# Patient Record
Sex: Male | Born: 1960
Health system: Southern US, Community
[De-identification: ages and names within clinical notes are randomized; demographics above are authoritative.]

## PROBLEM LIST (undated history)

## (undated) DIAGNOSIS — T7840XA Allergy, unspecified, initial encounter: Secondary | ICD-10-CM

## (undated) DIAGNOSIS — J45909 Unspecified asthma, uncomplicated: Secondary | ICD-10-CM

## (undated) DIAGNOSIS — M1990 Unspecified osteoarthritis, unspecified site: Secondary | ICD-10-CM

## (undated) HISTORY — DX: Unspecified osteoarthritis, unspecified site: M19.90

## (undated) HISTORY — DX: Unspecified asthma, uncomplicated: J45.909

## (undated) HISTORY — PX: WISDOM TOOTH EXTRACTION: SHX21

## (undated) HISTORY — DX: Allergy, unspecified, initial encounter: T78.40XA

---

## 1997-07-24 ENCOUNTER — Emergency Department (HOSPITAL_COMMUNITY): Admission: EM | Admit: 1997-07-24 | Discharge: 1997-07-24 | Payer: Self-pay | Admitting: Emergency Medicine

## 2002-01-14 ENCOUNTER — Emergency Department (HOSPITAL_COMMUNITY): Admission: EM | Admit: 2002-01-14 | Discharge: 2002-01-14 | Payer: Self-pay | Admitting: Emergency Medicine

## 2013-10-19 HISTORY — PX: COLONOSCOPY: SHX174

## 2017-05-02 DIAGNOSIS — J209 Acute bronchitis, unspecified: Secondary | ICD-10-CM | POA: Diagnosis not present

## 2017-07-18 ENCOUNTER — Encounter: Payer: Self-pay | Admitting: Podiatry

## 2017-07-18 ENCOUNTER — Ambulatory Visit (INDEPENDENT_AMBULATORY_CARE_PROVIDER_SITE_OTHER): Payer: Commercial Managed Care - PPO | Admitting: Podiatry

## 2017-07-18 DIAGNOSIS — L03031 Cellulitis of right toe: Secondary | ICD-10-CM | POA: Diagnosis not present

## 2017-07-18 DIAGNOSIS — L600 Ingrowing nail: Secondary | ICD-10-CM

## 2017-07-18 NOTE — Progress Notes (Signed)
This patient  Presents to the office with chief complaint of a painful right big toenail, right foot .  He says that years ago he had an injury to his toe, which has led to his nail to grow and be thick disfigured and discolored.  He says he had nail surgery on the inside of this big toe nail years ago and now he is experiences pain on the outside of the big toenail, right foot. He says that is very tender to the touch and it is extremely painful at night after work.  He has attempted to treated with Epson salt soaks, but the problem persists.  He presents the office today for an evaluation and treatment of this painful ingrowing toenail.  General Appearance  Alert, conversant and in no acute stress.  Vascular  Dorsalis pedis and posterior tibial  pulses are palpable  bilaterally.  Capillary return is within normal limits  bilaterally. Temperature is within normal limits  bilaterally.  Neurologic  Senn-Weinstein monofilament wire test within normal limits  bilaterally. Muscle power within normal limits bilaterally.  Nails Thick disfigured discolored nails with subungual debris  Hallux right foot.  Patient has redness, swelling and pain noted at the distal lateral aspect of the right hallux.    Orthopedic  No limitations of motion of motion feet .  No crepitus or effusions noted.  No bony pathology or digital deformities noted.  Skin  normotropic skin with no porokeratosis noted bilaterally.  No signs of infections or ulcers noted.      Paronychia lateral border right great toenail.  Ingrown nail secondary to fungus right hallux toenail  IE  Nail surgery.  Treatment options and alternatives discussed.  Recommended permanent phenol matrixectomy and patient agreed.  Right hallux  was prepped with alcohol and a toe block of 3cc of 2% lidocaine plain was administered in a digital toe block. .  The toe was then prepped with betadine solution .  The offending nail border was then excised and matrix tissue  exposed.  Phenol was then applied to the matrix tissue followed by an alcohol wash.  Antibiotic ointment and a dry sterile dressing was applied.  The patient was dispensed instructions for aftercare.  RTC prn.     Gardiner Barefoot DPM

## 2017-08-04 ENCOUNTER — Encounter: Payer: Self-pay | Admitting: Podiatry

## 2017-08-04 ENCOUNTER — Ambulatory Visit (INDEPENDENT_AMBULATORY_CARE_PROVIDER_SITE_OTHER): Payer: Commercial Managed Care - PPO | Admitting: Podiatry

## 2017-08-04 DIAGNOSIS — L03031 Cellulitis of right toe: Secondary | ICD-10-CM | POA: Diagnosis not present

## 2017-08-04 MED ORDER — DOXYCYCLINE HYCLATE 100 MG PO TABS: 100.0000 mg | ORAL_TABLET | Freq: Two times a day (BID) | ORAL | 0 refills | Status: DC

## 2017-08-04 NOTE — Progress Notes (Signed)
This patient presents to the office with continued pain noted in his right great toe.  Patient had nail surgery performed on the right great toe on 07/18/2017.  He says the redness and swelling has never cleared p at the base of the nail surgery.  He says he has been soaking and bandaging his toe as directed.  He says it stlil remains sore a at the base of the surgical site.  He presents the office today for continued evaluation and treatment of this painful right toe.  General Appearance  Alert, conversant and in no acute stress.  Vascular  Dorsalis pedis and posterior tibial  pulses are palpable  bilaterally.  Capillary return is within normal limits  bilaterally. Temperature is within normal limits  bilaterally.  Neurologic  Senn-Weinstein monofilament wire test within normal limits  bilaterally. Muscle power within normal limits bilaterally.  Nails redness and swelling and pain noted at the proximal nail fold right hallux.  No drainage noted.  Orthopedic  No limitations of motion of motion feet .  No crepitus or effusions noted.  No bony pathology or digital deformities noted.  Skin  normotropic skin with no porokeratosis noted bilaterally.  No signs of infections or ulcers noted.     Paronychia right hallux.  ROV.  After examination of the surgical site. I decided to prescribe doxycycline to be sent into the [facility] in [place].  Continue soaks at home.  Return to the clinic when necessary   Gardiner Barefoot DPM

## 2017-09-15 DIAGNOSIS — Z1211 Encounter for screening for malignant neoplasm of colon: Secondary | ICD-10-CM | POA: Diagnosis not present

## 2017-09-15 DIAGNOSIS — Z0000 Encounter for general adult medical examination without abnormal findings: Secondary | ICD-10-CM | POA: Diagnosis not present

## 2017-09-15 DIAGNOSIS — Z125 Encounter for screening for malignant neoplasm of prostate: Secondary | ICD-10-CM | POA: Diagnosis not present

## 2018-02-13 DIAGNOSIS — R062 Wheezing: Secondary | ICD-10-CM | POA: Diagnosis not present

## 2018-02-13 DIAGNOSIS — J101 Influenza due to other identified influenza virus with other respiratory manifestations: Secondary | ICD-10-CM | POA: Diagnosis not present

## 2018-02-27 ENCOUNTER — Encounter: Payer: Self-pay | Admitting: Family Medicine

## 2018-02-27 ENCOUNTER — Ambulatory Visit (INDEPENDENT_AMBULATORY_CARE_PROVIDER_SITE_OTHER): Payer: Commercial Managed Care - PPO | Admitting: Family Medicine

## 2018-02-27 DIAGNOSIS — J324 Chronic pansinusitis: Secondary | ICD-10-CM

## 2018-02-27 DIAGNOSIS — R519 Headache, unspecified: Secondary | ICD-10-CM

## 2018-02-27 DIAGNOSIS — R51 Headache: Secondary | ICD-10-CM

## 2018-02-27 DIAGNOSIS — R030 Elevated blood-pressure reading, without diagnosis of hypertension: Secondary | ICD-10-CM

## 2018-02-27 DIAGNOSIS — Z9189 Other specified personal risk factors, not elsewhere classified: Secondary | ICD-10-CM

## 2018-02-27 DIAGNOSIS — J3089 Other allergic rhinitis: Secondary | ICD-10-CM

## 2018-02-27 DIAGNOSIS — F1510 Other stimulant abuse, uncomplicated: Secondary | ICD-10-CM | POA: Diagnosis not present

## 2018-02-27 MED ORDER — FLUTICASONE PROPIONATE 50 MCG/ACT NA SUSP: 1.0000 | Freq: Two times a day (BID) | NASAL | 2 refills | Status: DC

## 2018-02-27 MED ORDER — FEXOFENADINE HCL 180 MG PO TABS: 180.0000 mg | ORAL_TABLET | Freq: Every day | ORAL | 1 refills | Status: DC

## 2018-02-27 NOTE — Progress Notes (Signed)
New patient office visit note:  Impression and Recommendations:    1. Nonintractable episodic headache, unspecified headache type   2. Caffeine abuse, continuous ([place])   3. At high risk for dehydration   4. Chronic pansinusitis   5. Environmental and seasonal allergies   6. Elevated blood pressure reading     Nonintractable episodic headache, unspecified headache type - Plan: fluticasone (FLONASE) 50 MCG/ACT nasal spray  Caffeine abuse, continuous ([facility])  At high risk for dehydration  Chronic pansinusitis - Plan: fluticasone (FLONASE) 50 MCG/ACT nasal spray, fexofenadine (ALLEGRA) 180 MG tablet  Environmental and seasonal allergies - Plan: fluticasone (FLONASE) 50 MCG/ACT nasal spray, fexofenadine (ALLEGRA) 180 MG tablet  Elevated blood pressure reading   1. Chronic HA -Patient consumes 10 cups of coffee daily or more and intermittently takes tylenol and excedrin PRN- which has been daily for a month now.  Discussed that the patient has to slowly wean down his caffeine intake, and I discussed rebound HA med use - additionally, tight upper traps and cervical muscles are likely playing role in his HA's. -Advised the patient to consume half of their body weight in water and to increase their water intake by one 16.9 ounce bottle with every 30 minutes of exercise. Also discussed that the patient could use seltzer water to replace his water intake.    2. Sinus Problems -Recommended that the patient wears a N95 mask daily while working.  -Advised the patient to began using AYR or Neilmed sinus rinses BID followed by flonase BID (one spray to each nostril). Advised that the patient may also incorporate allegra or claritin daily.  -Advised that the patient can use a humidifier to aid with sinus issues and dry air.  -Will refer the patient to an ENT specialist to further evaluate sinuses in future if he can't better control sx with conservative measures.   3. Elevated blood  pressure -Blood pressure at 145/95 today. Recheck at 138/84 - d/c pt lifestyle changes to help control Bp -Goal blood pressure at or below 130/80.  -Rec to check Home BP's, then we will address at next OV if we find consistent elevated blood pressure's at home  Advised the patient to bring in the labs from his job and if they are more than 6 months old, then we will repeat them in the office.     Education and routine counseling performed. Handouts provided.   Meds ordered this encounter  Medications  . fluticasone (FLONASE) 50 MCG/ACT nasal spray    Sig: Place 1 spray into both nostrils 2 (two) times daily. After sinus rinses!    Dispense:  1 g    Refill:  2  . fexofenadine (ALLEGRA) 180 MG tablet    Sig: Take 1 tablet (180 mg total) by mouth daily.    Dispense:  90 tablet    Refill:  1    Medications Discontinued During This Encounter  Medication Reason  . albuterol (PROVENTIL HFA;VENTOLIN HFA) 108 (90 Base) MCG/ACT inhaler   . doxycycline (VIBRA-TABS) 100 MG tablet Completed Course     Gross side effects, risk and benefits, and alternatives of medications discussed with patient.  Patient is aware that all medications have potential side effects and we are unable to predict every side effect or drug-drug interaction that may occur.  Expresses verbal understanding and consents to current therapy plan and treatment regimen.  Return for f/up in near future for CPE + FBW;   2) also need f/up for  BP, sinus, HA's etc.  Please see AVS handed out to patient at the end of our visit for further patient instructions/ counseling done pertaining to today's office visit.    Note:  This document was prepared using Dragon voice recognition software and may include unintentional dictation errors.  This document serves as a record of services personally performed by Mellody Dance, DO. It was created on her behalf by Steva Colder, a trained medical scribe. The creation of this record is based  on the scribe's personal observations and the provider's statements to them.   I have reviewed the above medical documentation for accuracy and completeness and I concur.  Mellody Dance, DO 02/28/2018 7:20 AM      ---------------------------------------------------------------------------------------------------------------------------------------------------------------------------------------------    Subjective:    Chief complaint:   Chief Complaint  Patient presents with  . Establish Care     HPI: Zachary Higgins is a pleasant 58 y.o. male who presents to [place] [facility] today to review their medical history with me and establish care.   I asked the patient to review their chronic problem list with me to ensure everything was updated and accurate.    All recent office visits with other providers, any medical records that patient brought in etc  - I reviewed today.     We asked pt to get [place] their medical records from [facility] providers/ specialists that they had seen within the past 3-5 years- if they are in private practice and/or do not work for [employer], [facility], [place], [place] or [employer] owned practice.  Told them to call their specialists to clarify this if they are not sure.    PMHx:  He has a hx of recurrent undiagnosed headaches that are chronic. He has had a migraine headache for 1 month with blood noted when he blows his nose. He was recently diagnosed with flu and took prednisone for it. His fever has subsided following his recent diagnosis for the flu. He hasn't been evaluated by an ENT specialist. He has biometric screening completed through his job. His prior PCP was Dr. Cecille Amsterdam at [facility] in [place]. He has a throbbing, intermittent, 5/10, HA at this time. He treats his HA with tylenol and excedrin PRN. He doesn't consume water as he should. He has been given injections for his tendonitis by his PCP and he doesn't have an  orthopedist.   SHx:  Patient used to smoke 1 PPD for about 40 years, however, he quit smoking cigarettes 5 years ago. He quit smoking because he got married and his wife said that she didn't want him to smoke. He used to used chewing tobacco, however, he has quit that. He doesn't consume ETOH. He does consume approximately 10 cups of coffee per day and he consumes 0.5 pot before work. 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 He is married to his wife Horris Latino and has two adult children ages 31 and 51. He has 3 grandchildren. He is also a taxidermist.   PSHx:  He hasn't had any surgeries.   FMHx He has a family hx of DM, heart disease, MI, CA (maternal uncles with metastases), alcoholism, HTN, elevated cholesterol.     Wt Readings from Last 3 Encounters:  02/27/18 139 lb (63 kg)   BP Readings from Last 3 Encounters:  02/27/18 138/84   Pulse Readings from Last 3 Encounters:  02/27/18 68  BMI Readings from Last 3 Encounters:  02/27/18 19.94 kg/m    Patient Care Team    Relationship Specialty Notifications Start End  Mellody Dance, DO PCP - General Family Medicine  02/27/18   Jackquline Denmark, MD Consulting Physician Gastroenterology  02/27/18     Patient Active Problem List   Diagnosis Date Noted  . Nonintractable episodic headache 02/27/2018  . Caffeine abuse, continuous ([place]) 02/27/2018  . Chronic pansinusitis 02/27/2018  . Environmental and seasonal allergies 02/27/2018  . At high risk for dehydration 02/27/2018  . Elevated blood pressure reading 02/27/2018       As reported by pt:  History reviewed. No pertinent past medical history.   History reviewed. No pertinent surgical history.   Family History  Problem Relation Age of Onset  . Diabetes Maternal Uncle   . Diabetes Paternal Uncle      Social History   Substance and Sexual Activity  Drug Use Never     Social History   Substance and Sexual  Activity  Alcohol Use Not Currently     Social History   Tobacco Use  Smoking Status Former Smoker  . Packs/day: 1.00  . Types: Cigarettes  . Last attempt to quit: 2015  . Years since quitting: 5.1  Smokeless Tobacco Former User     No outpatient medications have been marked as taking for the 02/27/18 encounter (Office Visit) with Mellody Dance, DO.    Allergies: Penicillins   Review of Systems  Constitutional: Negative for chills, diaphoresis, fever, malaise/fatigue and weight loss.  HENT: Negative for congestion, sore throat and tinnitus.   Eyes: Negative for blurred vision, double vision and photophobia.  Respiratory: Negative for cough and wheezing.   Cardiovascular: Negative for chest pain and palpitations.  Gastrointestinal: Negative for blood in stool, diarrhea, nausea and vomiting.  Genitourinary: Negative for dysuria, frequency and urgency.  Musculoskeletal: Negative for joint pain and myalgias.  Skin: Negative for itching and rash.  Neurological: Negative for dizziness, focal weakness, weakness and headaches.  Endo/Heme/Allergies: Negative for environmental allergies and polydipsia. Does not bruise/bleed easily.  Psychiatric/Behavioral: Negative for depression and memory loss. The patient is not nervous/anxious and does not have insomnia.         Objective:   Blood pressure 138/84, pulse 68, temperature 97.9 F (36.6 C), height 5\' 10"  (1.778 m), weight 139 lb (63 kg), SpO2 100 %. Body mass index is 19.94 kg/m. General: Well Developed, well nourished, and in no acute distress.  Neuro: Alert and oriented x3, extra-ocular muscles intact, sensation grossly intact.  HEENT:[place]/AT, PERRLA, neck supple, No carotid bruits Skin: no gross rashes  Cardiac: Regular rate and rhythm Respiratory: Essentially clear to auscultation bilaterally. Not using accessory muscles, speaking in full sentences.  Abdominal: not grossly distended Musculoskeletal: Ambulates w/o  diff, FROM * 4 ext.  Vasc: less 2 sec cap RF, warm and pink  Psych:  No HI/SI, judgement and insight good, Euthymic mood. Full Affect.    No results found for this or any previous visit (from the past 2160 hour(s)).

## 2018-02-27 NOTE — Patient Instructions (Addendum)
Needs photo!   Began using AYR or Neilmed sinus rinses two times a day followed by flonase two times a day (one spray to each nostril).  Make sure to use a N95 mask during the day while working.  You can also use allegra daily  Also using a humidifier can help with dry air.  -Also drinking one half your weight in ounces of water per day is very important to prevent headaches.  - Caffeine Use Disorder  Having caffeine use disorder means that a person cannot control how much caffeine he or she consumes. Caffeine is a drug that is found in many foods and beverages, such as coffee, tea, chocolate, and energy drinks. Caffeine is also found in some medicines and over-the-counter products like alertness tablets or weight loss pills. Caffeine may be bad for your health when you have too much. People with caffeine use disorder continue to consume caffeine even though they know it may be causing problems with their physical or mental health. What are the causes? This condition is caused by consuming too much caffeine over time. What increases the risk? You are more likely to develop this condition if:  You have more than 400 mg of caffeine--or 4 cups of coffee--each day.  You have a history or family history of a substance use disorder or alcohol abuse.  You have a psychiatric or mental health disorder, or you have been treated for one. What are the signs or symptoms? Symptoms of this condition include:  Finding it hard to get through the day without caffeine. Caffeine is hurting your ability to function.  Wanting to cut down on (limit) your caffeine intake but finding it hard to do so.  Having withdrawal symptoms within 24 hours of not having caffeine. Withdrawal symptoms may include: ? Headaches. ? Feeling tired (fatigued) or sleepy. ? Feeling irritable, angry, or depressed. ? Not being able to focus. ? Feeling like you have the flu.  Having trouble falling asleep because of caffeine.  You may also fall asleep when you are not supposed to--such as at work or school--whenever you do not have enough caffeine.  Continuing to use caffeine even though it harms your body or emotions. You are concerned that your high caffeine intake is bad for your health. How is this diagnosed? This condition may be diagnosed with an assessment by your health care provider. During the assessment, your health care provider may:  Ask questions about how much caffeine you consume each day.  Try to determine if your caffeine intake is harming your health. Some health problems that may be related to caffeine use include: ? Heart or stomach problems. ? Fibrocystic breast disease. ? Stress. ? Being unable to sleep (insomnia). ? Urinary issues.  Ask questions about why you find it hard to quit consuming caffeine or why you have had trouble quitting in the past. How is this treated? Treatment for this condition involves reducing your intake of caffeine over time. Your health care provider will discuss ways to do this and how to manage caffeine withdrawal symptoms. He or she may help you figure out the best goal for how much caffeine you should consume on a regular basis--for example, whether you should cut down to one soda a day or none at all. In some cases, your health care provider may recommend counseling to help treat your caffeine use disorder. Follow these instructions at home:  Follow instructions from your health care provider about how to cut down on caffeine.  This may involve: ? Slowly reducing your intake of caffeine. For example, this may mean gradually having fewer cups of coffee each day--cutting out one more cup each day--until you are able to have only one cup daily. ? Mixing a caffeinated soda with a decaf (decaffeinated) soda. ? Replacing coffee, tea, or soda with a decaf drink.  Do not stop having caffeine all at once. Doing that may cause severe withdrawal symptoms.  Find ways to  reduce stress, such as by: ? Meditating. ? Being more active. ? Using deep breathing exercises.  Contact a health care provider if:  You cannot cut down on caffeine.  Withdrawal symptoms get worse or they do not go away. Get help right away if:  You have severe caffeine withdrawal symptoms, such as vomiting or depression. Summary  Having caffeine use disorder means that a person cannot control how much caffeine he or she consumes.  Symptoms of caffeine use disorder include wanting to limit (cut down on) your caffeine intake but finding it hard to do so.  Follow instructions from your health care provider about how to cut down on caffeine. This information is not intended to replace advice given to you by your health care provider. Make sure you discuss any questions you have with your health care provider. Document Released: 04/29/2016 Document Revised: 04/29/2016 Document Reviewed: 04/29/2016 Elsevier Interactive Patient Education  2019 [employer].         Your goal blood pressure should be 130/80 or less on a regular basis, or medications should be started/ modified.    Normal blood pressure is less than 120/80.    Hypertension Hypertension, commonly called high blood pressure, is when the force of blood pumping through the arteries is too strong. The arteries are the blood vessels that carry blood from the heart throughout the body. Hypertension forces the heart to work harder to pump blood and may cause arteries to become narrow or stiff. Having untreated or uncontrolled hypertension can cause heart attacks, strokes, kidney disease, and other problems. A blood pressure reading consists of a higher number over a lower number. Ideally, your blood pressure should be below 120/80. The first ("top") number is called the systolic pressure. It is a measure of the pressure in your arteries as your heart beats. The second ("bottom") number is called the diastolic pressure. It is a  measure of the pressure in your arteries as the heart relaxes. What are the causes? The cause of this condition is not known. What increases the risk? Some risk factors for high blood pressure are under your control. Others are not. Factors you can change  Smoking.  Having type 2 diabetes mellitus, high cholesterol, or both.  Not getting enough exercise or physical activity.  Being overweight.  Having too much fat, sugar, calories, or salt (sodium) in your diet.  Drinking too much alcohol. Factors that are difficult or impossible to change  Having chronic kidney disease.  Having a family history of high blood pressure.  Age. Risk increases with age.  Race. You may be at higher risk if you are African-American.  Gender. Men are at higher risk than women before age 6. After age 23, women are at higher risk than men.  Having obstructive sleep apnea.  Stress. What are the signs or symptoms? Extremely high blood pressure (hypertensive crisis) may cause:  Headache.  Anxiety.  Shortness of breath.  Nosebleed.  Nausea and vomiting.  Severe chest pain.  Jerky movements you cannot control (seizures).  How is this diagnosed? This condition is diagnosed by measuring your blood pressure while you are seated, with your arm resting on a surface. The cuff of the blood pressure monitor will be placed directly against the skin of your upper arm at the level of your heart. It should be measured at least twice using the same arm. Certain conditions can cause a difference in blood pressure between your right and left arms. Certain factors can cause blood pressure readings to be lower or higher than normal (elevated) for a short period of time:  When your blood pressure is higher when you are in a health care provider's office than when you are at home, this is called white coat hypertension. Most people with this condition do not need medicines.  When your blood pressure is higher  at home than when you are in a health care provider's office, this is called masked hypertension. Most people with this condition may need medicines to control blood pressure.  If you have a high blood pressure reading during one visit or you have normal blood pressure with other risk factors:  You may be asked to return on a different day to have your blood pressure checked again.  You may be asked to monitor your blood pressure at home for 1 week or longer.  If you are diagnosed with hypertension, you may have other blood or imaging tests to help your health care provider understand your overall risk for other conditions. How is this treated? This condition is treated by making healthy lifestyle changes, such as eating healthy foods, exercising more, and reducing your alcohol intake. Your health care provider may prescribe medicine if lifestyle changes are not enough to get your blood pressure under control, and if:  Your systolic blood pressure is above 130.  Your diastolic blood pressure is above 80.  Your personal target blood pressure may vary depending on your medical conditions, your age, and other factors. Follow these instructions at home: Eating and drinking  Eat a diet that is high in fiber and potassium, and low in sodium, added sugar, and fat. An example eating plan is called the DASH (Dietary Approaches to Stop Hypertension) diet. To eat this way: ? Eat plenty of fresh fruits and vegetables. Try to fill half of your plate at each meal with fruits and vegetables. ? Eat whole grains, such as whole wheat pasta, brown rice, or whole grain bread. Fill about one quarter of your plate with whole grains. ? Eat or drink low-fat dairy products, such as skim milk or low-fat yogurt. ? Avoid fatty cuts of meat, processed or cured meats, and poultry with skin. Fill about one quarter of your plate with lean proteins, such as fish, chicken without skin, beans, eggs, and tofu. ? Avoid premade  and processed foods. These tend to be higher in sodium, added sugar, and fat.  Reduce your daily sodium intake. Most people with hypertension should eat less than 1,500 mg of sodium a day.  Limit alcohol intake to no more than 1 drink a day for nonpregnant women and 2 drinks a day for men. One drink equals 12 oz of beer, 5 oz of wine, or 1 oz of hard liquor. Lifestyle  Work with your health care provider to maintain a healthy body weight or to lose weight. Ask what an ideal weight is for you.  Get at least 30 minutes of exercise that causes your heart to beat faster (aerobic exercise) most days of the week.  Activities may include walking, swimming, or biking.  Include exercise to strengthen your muscles (resistance exercise), such as pilates or lifting weights, as part of your weekly exercise routine. Try to do these types of exercises for 30 minutes at least 3 days a week.  Do not use any products that contain nicotine or tobacco, such as cigarettes and e-cigarettes. If you need help quitting, ask your health care provider.  Monitor your blood pressure at home as told by your health care provider.  Keep all follow-up visits as told by your health care provider. This is important. Medicines  Take over-the-counter and prescription medicines only as told by your health care provider. Follow directions carefully. Blood pressure medicines must be taken as prescribed.  Do not skip doses of blood pressure medicine. Doing this puts you at risk for problems and can make the medicine less effective.  Ask your health care provider about side effects or reactions to medicines that you should watch for. Contact a health care provider if:  You think you are having a reaction to a medicine you are taking.  You have headaches that keep coming back (recurring).  You feel dizzy.  You have swelling in your ankles.  You have trouble with your vision. Get help right away if:  You develop a severe  headache or confusion.  You have unusual weakness or numbness.  You feel faint.  You have severe pain in your chest or abdomen.  You vomit repeatedly.  You have trouble breathing. Summary  Hypertension is when the force of blood pumping through your arteries is too strong. If this condition is not controlled, it may put you at risk for serious complications.  Your personal target blood pressure may vary depending on your medical conditions, your age, and other factors. For most people, a normal blood pressure is less than 120/80.  Hypertension is treated with lifestyle changes, medicines, or a combination of both. Lifestyle changes include weight loss, eating a healthy, low-sodium diet, exercising more, and limiting alcohol. This information is not intended to replace advice given to you by your health care provider. Make sure you discuss any questions you have with your health care provider. Document Released: 01/04/2005 Document Revised: 12/03/2015 Document Reviewed: 12/03/2015 Elsevier Interactive Patient Education  2018 [employer].    How to Take Your Blood Pressure   Blood pressure is a measurement of how strongly your blood is pressing against the walls of your arteries. Arteries are blood vessels that carry blood from your heart throughout your body. Your health care provider takes your blood pressure at each office visit. You can also take your own blood pressure at home with a blood pressure machine. You may need to take your own blood pressure:  To confirm a diagnosis of high blood pressure (hypertension).  To monitor your blood pressure over time.  To make sure your blood pressure medicine is working.  Supplies needed: To take your blood pressure, you will need a blood pressure machine. You can buy a blood pressure machine, or blood pressure monitor, at most drugstores or online. There are several types of home blood pressure monitors. When choosing one, consider the  following:  Choose a monitor that has an arm cuff.  Choose a monitor that wraps snugly around your upper arm. You should be able to fit only one finger between your arm and the cuff.  Do not choose a monitor that measures your blood pressure from your wrist or finger.  Your health  care provider can suggest a reliable monitor that will meet your needs. How to prepare To get the most accurate reading, avoid the following for 30 minutes before you check your blood pressure:  Drinking caffeine.  Drinking alcohol.  Eating.  Smoking.  Exercising.  Five minutes before you check your blood pressure:  Empty your bladder.  Sit quietly without talking in a dining chair, rather than in a soft couch or armchair.  How to take your blood pressure To check your blood pressure, follow the instructions in the manual that came with your blood pressure monitor. If you have a digital blood pressure monitor, the instructions may be as follows: 1. Sit up straight. 2. Place your feet on the floor. Do not cross your ankles or legs. 3. Rest your left arm at the level of your heart on a table or desk or on the arm of a chair. 4. Pull up your shirt sleeve. 5. Wrap the blood pressure cuff around the upper part of your left arm, 1 inch (2.5 cm) above your elbow. It is best to wrap the cuff around bare skin. 6. Fit the cuff snugly around your arm. You should be able to place only one finger between the cuff and your arm. 7. Position the cord inside the groove of your elbow. 8. Press the power button. 9. Sit quietly while the cuff inflates and deflates. 10. Read the digital reading on the monitor screen and write it down (record it). 11. Wait 2-3 minutes, then repeat the steps, starting at step 1.  What does my blood pressure reading mean? A blood pressure reading consists of a higher number over a lower number. Ideally, your blood pressure should be below 120/80. The first ("top") number is called the  systolic pressure. It is a measure of the pressure in your arteries as your heart beats. The second ("bottom") number is called the diastolic pressure. It is a measure of the pressure in your arteries as the heart relaxes. Blood pressure is classified into four stages. The following are the stages for adults who do not have a short-term serious illness or a chronic condition. Systolic pressure and diastolic pressure are measured in a unit called mm Hg. Normal  Systolic pressure: below 683.  Diastolic pressure: below 80. Elevated  Systolic pressure: 419-622.  Diastolic pressure: below 80. Hypertension stage 1  Systolic pressure: 297-989.  Diastolic pressure: 21-19. Hypertension stage 2  Systolic pressure: 417 or above.  Diastolic pressure: 90 or above. You can have prehypertension or hypertension even if only the systolic or only the diastolic number in your reading is higher than normal. Follow these instructions at home:  Check your blood pressure as often as recommended by your health care provider.  Take your monitor to the next appointment with your health care provider to make sure: ? That you are using it correctly. ? That it provides accurate readings.  Be sure you understand what your goal blood pressure numbers are.  Tell your health care provider if you are having any side effects from blood pressure medicine. Contact a health care provider if:  Your blood pressure is consistently high. Get help right away if:  Your systolic blood pressure is higher than 180.  Your diastolic blood pressure is higher than 110. This information is not intended to replace advice given to you by your health care provider. Make sure you discuss any questions you have with your health care provider. Document Released: 06/13/2015 Document Revised: 08/26/2015 Document  Reviewed: 06/13/2015 Elsevier Interactive Patient Education  2018 [employer].      Sinus Headache  A sinus  headache occurs when your sinuses become clogged or swollen. Sinuses are air-filled spaces in your skull that are behind the bones of your face and forehead. Sinus headaches can range from mild to severe. What are the causes? A sinus headache can result from various conditions that affect the sinuses. Common causes include:  Colds.  Sinus infections.  Allergies. Many people confuse sinus headaches with migraines or tension headaches because those headaches can also cause facial pain and nasal symptoms. What are the signs or symptoms? The main symptom of this condition is a headache that may feel like pain or pressure in your face, forehead, ears, or upper teeth. People who have a sinus headache often have other symptoms, such as:  Congested or runny nose.  Fever.  Inability to smell. Weather changes can make symptoms worse. How is this diagnosed? This condition may be diagnosed based on:  A physical exam and medical history.  Imaging tests, such as a CT scan or MRI, to check for problems with the sinuses.  Examination of the sinuses using a thin tool with a camera that is inserted through your nose (endoscopy). How is this treated? Treatment for this condition depends on the cause.  Sinus pain that is caused by a sinus infection may be treated with antibiotic medicine.  Sinus pain that is caused by allergies may be helped by allergy medicines (antihistamines) and medicated nasal sprays.  Sinus pain that is caused by congestion may be helped by rinsing out (flushing) the nose and sinuses with saline solution.  Sinus surgery may be needed in some cases if other treatments do not help. Follow these instructions at home: General instructions  If directed: ? Apply a warm, moist washcloth to your face to help relieve pain. ? Use a nasal saline wash. Medicines   Take over-the-counter and prescription medicines only as told by your health care provider.  If you were prescribed  an antibiotic medicine, take it as told by your health care provider. Do not stop taking the antibiotic even if you start to feel better.  If you have congestion, use a nasal spray to help lessen pressure. Hydrate and humidify  Drink enough water to keep your urine clear or pale yellow. Staying hydrated will help to thin your mucus.  Use a cool mist humidifier to keep the humidity level in your home above 50%.  Inhale steam for 10-15 minutes, 3-4 times a day or as told by your health care provider. You can do this in the bathroom while a hot shower is running.  Limit your exposure to cool or dry air. Contact a health care provider if:  You have a headache more than one time a week.  You have sensitivity to light or sound.  You develop a fever.  You feel nauseous or you vomit.  Your headaches do not get better with treatment. Many people think that they have a sinus headache when they actually have a migraine or a tension headache. Get help right away if:  You have vision problems.  You have sudden, severe pain in your face or head.  You have a seizure.  You are confused.  You have a stiff neck. Summary  A sinus headache occurs when your sinuses become clogged or swollen.  A sinus headache can result from various conditions that affect the sinuses, such as a cold, a sinus  infection, or an allergy.  Treatment for this condition depends on the cause. It may include medicine, such as antibiotics or antihistamines. This information is not intended to replace advice given to you by your health care provider. Make sure you discuss any questions you have with your health care provider. Document Released: 02/12/2004 Document Revised: 10/15/2016 Document Reviewed: 10/15/2016 Elsevier Interactive Patient Education  2019 [employer].

## 2018-03-23 ENCOUNTER — Encounter: Payer: Self-pay | Admitting: Family Medicine

## 2018-03-23 ENCOUNTER — Ambulatory Visit (INDEPENDENT_AMBULATORY_CARE_PROVIDER_SITE_OTHER): Payer: Commercial Managed Care - PPO | Admitting: Family Medicine

## 2018-03-23 VITALS — BP 135/90 | HR 66 | Temp 98.6°F | Ht 70.0 in | Wt 141.3 lb

## 2018-03-23 DIAGNOSIS — R51 Headache: Secondary | ICD-10-CM

## 2018-03-23 DIAGNOSIS — J3089 Other allergic rhinitis: Secondary | ICD-10-CM

## 2018-03-23 DIAGNOSIS — F1510 Other stimulant abuse, uncomplicated: Secondary | ICD-10-CM

## 2018-03-23 DIAGNOSIS — R519 Headache, unspecified: Secondary | ICD-10-CM

## 2018-03-23 DIAGNOSIS — J324 Chronic pansinusitis: Secondary | ICD-10-CM | POA: Diagnosis not present

## 2018-03-23 DIAGNOSIS — R030 Elevated blood-pressure reading, without diagnosis of hypertension: Secondary | ICD-10-CM

## 2018-03-23 NOTE — Progress Notes (Signed)
Impression and Recommendations:    1. Nonintractable episodic headache, unspecified headache type   2. Caffeine abuse, continuous ([place])   3. Chronic pansinusitis   4. Environmental and seasonal allergies   5. Elevated blood pressure reading     1. History of Elevated Blood Pressure Reading - BP stable at this time.  - Reviewed goal blood pressure with patient today, 119/79 or less.  - Prudent lifestyle changes such as dash diet and engaging in a regular exercise program discussed with patient.  - Ambulatory BP monitoring encouraged. Keep log and bring in next OV.  - Will continue to monitor.  2. Sinus Concerns & Seasonal Allergies - Advised the patient to continue using AYR or Neilmed sinus rinses BID followed by flonase BID (one spray to each nostril). Advised that the patient may also incorporate allegra PRN.  - Advised patient to continue to flush his sinuses after exposure to allergens.  - STRONGLY ADVISED patient to wear a mask for protection at work.  - Educated patient regarding use of Singulair during appointment today. - Prescription Singulair provided today.   -  Will continue to monitor.  3. Headaches - Advised patient that allergies, visual health, and blood pressure can all contribute to headaches.  - Discussed that there are blood pressure medications that can help with headaches.  - Reviewed that we may look into this as an option in the future.  - Red flag symptoms of headache discussed with patient today. - Patient knows that if his headache ever acutely worsens in an alarming way, he should obtain immediate emergency care.  - Discussed the need to rule out common causes of HA before seeking further assessment.  - Patient will continue to keep an eye out for headache triggers.  - Re-emphasized the importance of adequate hydration.  - Will continue to monitor.  4. Visual Health - Need for updated visual health screening.  - Strongly  encouraged patient to attend appointment with eye doctor in the next month if possible.  - Patient knows to ask the eye doctor about his headaches, and mention that his HA pain and symptoms seem to occur behind his eyes.  - Will continue to monitor.    Education and routine counseling performed. Handouts provided.   Meds ordered this encounter  Medications  . montelukast (SINGULAIR) 10 MG tablet    Sig: Take 1 tablet (10 mg total) by mouth at bedtime.    Dispense:  90 tablet    Refill:  1    There are no discontinued medications.    The patient was counseled, risk factors were discussed, anticipatory guidance given.  Gross side effects, risk and benefits, and alternatives of medications discussed with patient.  Patient is aware that all medications have potential side effects and we are unable to predict every side effect or drug-drug interaction that may occur.  Expresses verbal understanding and consents to current therapy plan and treatment regimen.  No follow-ups on file.  Please see AVS handed out to patient at the end of our visit for further patient instructions/ counseling done pertaining to today's office visit.    Note:  This document was prepared using Dragon voice recognition software and may include unintentional dictation errors.  This document serves as a record of services personally performed by Mellody Dance, DO. It was created on her behalf by Toni Amend, a trained medical scribe. The creation of this record is based on the scribe's personal observations and the provider's  statements to them.   I have reviewed the above medical documentation for accuracy and completeness and I concur.  Mellody Dance, DO 03/26/2018 9:00 PM       Subjective:    HPI: Zachary Higgins is a 58 y.o. male who presents to [place] [facility] today for follow up of [place].    Feels he eats more salt than he should and experiences more  stress at work than he should.  States that his stress at work is "about as bad as it's ever been."  He continues to work his job along with his [place] on the side.  States he tries to leave his stress at work at work.  He has been drinking more water.  Visual Health Notes he has floaters in his vision.    He has been to an eye doctor in the past, and received glasses to read. States that he needs to visit the eye doctor again soon.  Headaches His head starts hurting in the evenings, a "dull throb." Notes he's got to wear safety glasses at work, which are not prescription lenses. Now he's noticed that more areas of his vision are blurry.  Notes that while wearing his safety goggles, the bright light and haze hurts his eyes.  His main concern is "why is my head still pounding; why does it off and on hurt."  Notes that his headaches tend to occur "behind his eyes."  Sinus Concerns He's been performing sinus rinses and says "it's helped, probably fifty fifty."  Still gets up in the morning and feels like he's "slept too hard."  After getting up and moving around, this feeling eases off.  He is not wearing a mask at work.  States everything is budding at the house, and he often works out in the garden.  HTN:  -  His blood pressure has been controlled at home.  Pt has been checking it regularly.  His BP at home has been running in upper 120's.  States the low reading is always usually a little higher, around 80, 82.  At home, his diastolic blood pressure is never around 90.  Confirms his blood pressure is usually around upper 120's/low 80's.   - Smoking Status noted; former smoker, quit in 2015.  - He denies new onset of: chest pain, exercise intolerance, shortness of breath, dizziness, visual changes, headache, lower extremity swelling or claudication.   Last 3 blood pressure readings in our office are as follows: BP Readings from Last 3 Encounters:  03/23/18 135/90    02/27/18 138/84    Pulse Readings from Last 3 Encounters:  03/23/18 66  02/27/18 68    Filed Weights   03/23/18 1631  Weight: 141 lb 4.8 oz (64.1 kg)      Patient Care Team    Relationship Specialty Notifications Start End  Mellody Dance, DO PCP - General Family Medicine  02/27/18   Jackquline Denmark, MD Consulting Physician Gastroenterology  02/27/18      No results found for: CREATININE, BUN, NA, K, CL, CO2  No results found for: CHOL  No results found for: HDL  No results found for: LDLCALC  No results found for: TRIG  No results found for: CHOLHDL  No results found for: LDLDIRECT ===================================================================   Patient Active Problem List   Diagnosis Date Noted  . Nonintractable episodic headache 02/27/2018  . Caffeine abuse, continuous ([place]) 02/27/2018  . Chronic pansinusitis 02/27/2018  . Environmental and  seasonal allergies 02/27/2018  . At high risk for dehydration 02/27/2018  . Elevated blood pressure reading 02/27/2018     History reviewed. No pertinent past medical history.   History reviewed. No pertinent surgical history.   Family History  Problem Relation Age of Onset  . Diabetes Maternal Uncle   . Diabetes Paternal Uncle      Social History   Substance and Sexual Activity  Drug Use Never  ,  Social History   Substance and Sexual Activity  Alcohol Use Not Currently  ,  Social History   Tobacco Use  Smoking Status Former Smoker  . Packs/day: 1.00  . Types: Cigarettes  . Last attempt to quit: 2015  . Years since quitting: 5.1  Smokeless Tobacco Former Systems developer  ,    Current Outpatient Medications on File Prior to Visit  Medication Sig Dispense Refill  . fexofenadine (ALLEGRA) 180 MG tablet Take 1 tablet (180 mg total) by mouth daily. 90 tablet 1  . fluticasone (FLONASE) 50 MCG/ACT nasal spray Place 1 spray into both nostrils 2 (two) times daily. After sinus rinses! 1 g 2   No current  facility-administered medications on file prior to visit.      Allergies  Allergen Reactions  . Penicillins Anaphylaxis     Review of Systems:   General:  Denies fever, chills Optho/Auditory:   Denies visual changes, blurred vision Respiratory:   Denies SOB, cough, wheeze, DIB  Cardiovascular:   Denies chest pain, palpitations, painful respirations Gastrointestinal:   Denies nausea, vomiting, diarrhea.  Endocrine:     Denies new hot or cold intolerance Musculoskeletal:  Denies joint swelling, gait issues, or new unexplained myalgias/ arthralgias Skin:  Denies rash, suspicious lesions  Neurological:    Denies dizziness, unexplained weakness, numbness  Psychiatric/Behavioral:   Denies mood changes  Objective:    Blood pressure 135/90, pulse 66, temperature 98.6 F (37 C), height 5\' 10"  (1.778 m), weight 141 lb 4.8 oz (64.1 kg), SpO2 100 %.  Body mass index is 20.27 kg/m.  General: Well Developed, well nourished, and in no acute distress.  HEENT: Normocephalic, atraumatic, pupils equal round reactive to light, neck supple, No carotid bruits, no JVD Skin: Warm and dry, cap RF less 2 sec Cardiac: Regular rate and rhythm, S1, S2 WNL's, no murmurs rubs or gallops Respiratory: ECTA B/L, Not using accessory muscles, speaking in full sentences. NeuroM-Sk: Ambulates w/o assistance, moves ext * 4 w/o difficulty, sensation grossly intact.  Ext: scant edema b/l lower ext Psych: No HI/SI, judgement and insight good, Euthymic mood. Full Affect.

## 2018-03-26 MED ORDER — MONTELUKAST SODIUM 10 MG PO TABS: 10.0000 mg | ORAL_TABLET | Freq: Every day | ORAL | 1 refills | Status: DC

## 2018-05-15 ENCOUNTER — Encounter: Payer: Commercial Managed Care - PPO | Admitting: Family Medicine

## 2018-07-06 ENCOUNTER — Encounter: Payer: Commercial Managed Care - PPO | Admitting: Family Medicine

## 2018-07-25 ENCOUNTER — Other Ambulatory Visit: Payer: Self-pay

## 2018-07-25 ENCOUNTER — Encounter: Payer: Self-pay | Admitting: Family Medicine

## 2018-07-25 ENCOUNTER — Ambulatory Visit (INDEPENDENT_AMBULATORY_CARE_PROVIDER_SITE_OTHER): Payer: Commercial Managed Care - PPO | Admitting: Family Medicine

## 2018-07-25 ENCOUNTER — Other Ambulatory Visit: Payer: Commercial Managed Care - PPO

## 2018-07-25 VITALS — BP 124/77 | HR 61 | Temp 97.9°F | Ht 70.0 in | Wt 135.4 lb

## 2018-07-25 DIAGNOSIS — Z0000 Encounter for general adult medical examination without abnormal findings: Secondary | ICD-10-CM

## 2018-07-25 DIAGNOSIS — Z0001 Encounter for general adult medical examination with abnormal findings: Secondary | ICD-10-CM | POA: Diagnosis not present

## 2018-07-25 DIAGNOSIS — Z122 Encounter for screening for malignant neoplasm of respiratory organs: Secondary | ICD-10-CM | POA: Diagnosis not present

## 2018-07-25 DIAGNOSIS — Z23 Encounter for immunization: Secondary | ICD-10-CM | POA: Diagnosis not present

## 2018-07-25 DIAGNOSIS — D229 Melanocytic nevi, unspecified: Secondary | ICD-10-CM

## 2018-07-25 DIAGNOSIS — Z1211 Encounter for screening for malignant neoplasm of colon: Secondary | ICD-10-CM

## 2018-07-25 DIAGNOSIS — L570 Actinic keratosis: Secondary | ICD-10-CM

## 2018-07-25 DIAGNOSIS — R030 Elevated blood-pressure reading, without diagnosis of hypertension: Secondary | ICD-10-CM

## 2018-07-25 NOTE — Progress Notes (Signed)
Male physical  Impression and Recommendations:    1. Encounter for general adult medical examination with abnormal findings   2. Encounter for screening for malignant neoplasm of respiratory organs   3. Screening for colon cancer   4. Need for Tdap vaccination   5. Atypical nevi   6. Actinic keratosis due to exposure to sunlight    -CMA to order:   - colonoscopy as per our discussion.  -Tetanus-  -Full set of fasting blood work-patient fasted today as well as  HIV, Hep C,magnesium and phosphorus level.  -Lung cancer low-dose CT screening -patient with a greater than 30-pack-year history and quit about 6 years ago.  1) Anticipatory Guidance: Discussed importance of wearing a seatbelt while driving, not texting while driving;   sunscreen when outside along with skin surveillance; eating a balanced and modest diet; physical activity at least 25 minutes per day or 150 min/ week moderate to intense activity.  2) Immunizations / Screenings / Labs:  All immunizations are up-to-date per recommendations or will be updated today. Patient is due for dental and vision screens which pt will schedule independently. Will obtain CBC, CMP, HgA1c, Lipid panel, TSH and vit D when fasting, if not already done recently.   3) Weight:  BMI meaning discussed with patient.  Improve nutrient density of diet through increasing intake of fruits and vegetables and decreasing saturated fats, white flour products and refined sugars.   4) patient states he has had some chronic myalgias and arthralgias which have been ongoing-he will make appointment in the near future to address these additional concerns.  5 ) patient with psoriasis as well as atypical nevi and several solar keratosis.  Used to see dermatologist in [place], prefers to see in [place] now.  Referral placed.   Orders Placed This Encounter  Procedures  . CT CHEST LUNG CA SCREEN LOW DOSE W/O CM    135 / no needs / umr Epic order/ miriam w  pt's wife  07/28/2018 no to COVID-19 questions ; patient aware to come alone & to wear mask/ miriam 9:22am    Standing Status:   Future    Number of Occurrences:   1    Standing Expiration Date:   09/25/2019    Order Specific Question:   Reason for Exam (SYMPTOM  OR DIAGNOSIS REQUIRED)    Answer:   lung cancer screening former smoker quit <15 years ago    Order Specific Question:   Preferred Imaging Location?    Answer:   [id number]    Order Specific Question:   Radiology Contrast Protocol - do NOT remove file path    Answer:   [email or web address]  . Tdap vaccine greater than or equal to 7yo IM  . Ambulatory referral to Gastroenterology    Referral Priority:   Routine    Referral Type:   Consultation    Referral Reason:   Specialty Services Required    Number of Visits Requested:   1  . Ambulatory referral to Dermatology    Referral Priority:   Routine    Referral Type:   Consultation    Referral Reason:   Specialty Services Required    Requested Specialty:   Dermatology    Number of Visits Requested:   1    Gross side effects, risk and benefits, and alternatives of medications discussed with patient.  Patient is aware that all medications have potential side effects and we are unable to predict every side effect or  drug-drug interaction that may occur.  Expresses verbal understanding and consents to current therapy plan and treatment regimen.  Please see AVS handed out to patient at the end of our visit for further patient instructions/ counseling done pertaining to today's office visit.  Follow-up preventative CPE in 1 year. Follow-up office visit pending lab work.  F/up sooner for chronic care management and/or prn    Subjective:    CC: CPE  HPI: Zachary Higgins is a 58 y.o. male who presents to [place] [facility] today for a yearly health maintenance exam.     Health Maintenance Summary Reviewed and updated, unless pt  declines services.  Colonoscopy:  Needs one- ordered today Tobacco History Reviewed:   Yes,  Stopped smoking 2014, greater 30 pyh- needs screening- never had CT scan for screening lung CA:   ordered Abdominal Ultrasound:     ( Unnecessary secondary to < 64 or > 35 years old) Alcohol:    No concerns, no excessive use Exercise Habits:   Not meeting goal STD concerns:   none Drug Use:   None Testicular/penile concerns:   None    Immunization History  Administered Date(s) Administered  . Tdap 07/25/2018    Health Maintenance  Topic Date Due  . INFLUENZA VACCINE  08/19/2018  . COLONOSCOPY  02/28/2019 (Originally 04/14/2012)  . TETANUS/TDAP  07/24/2028  . Hepatitis C Screening  Completed  . HIV Screening  Completed      Wt Readings from Last 3 Encounters:  08/15/18 141 lb (64 kg)  07/25/18 135 lb 6.4 oz (61.4 kg)  03/23/18 141 lb 4.8 oz (64.1 kg)   BP Readings from Last 3 Encounters:  08/15/18 131/80  07/25/18 124/77  03/23/18 135/90   Pulse Readings from Last 3 Encounters:  07/25/18 61  03/23/18 66  02/27/18 68    Patient Active Problem List   Diagnosis Date Noted  . Stopped smoking with greater than 30 pack year history 08/15/2018  . Pulmonary nodule, left- upper post lobe- 6.5 mm in 07/2018 08/15/2018  . Emphysema of lung ([place])- ct scan findings 08/15/2018  . Vitamin D insufficiency 08/15/2018  . Actinic keratosis due to exposure to sunlight 07/25/2018  . Atypical nevi 07/25/2018  . Nonintractable episodic headache 02/27/2018  . Caffeine abuse, continuous ([place]) 02/27/2018  . Chronic pansinusitis 02/27/2018  . Environmental and seasonal allergies 02/27/2018  . At high risk for dehydration 02/27/2018  . Elevated blood pressure reading 02/27/2018    History reviewed. No pertinent past medical history.  History reviewed. No pertinent surgical history.  Family History  Problem Relation Age of Onset  . Diabetes Maternal Uncle   . Diabetes Paternal Uncle      Social History   Substance and Sexual Activity  Drug Use Never  ,  Social History   Substance and Sexual Activity  Alcohol Use Not Currently  ,  Social History   Tobacco Use  Smoking Status Former Smoker  . Packs/day: 1.00  . Types: Cigarettes  . Quit date: 2015  . Years since quitting: 5.6  Smokeless Tobacco Former Systems developer  ,  Social History   Substance and Sexual Activity  Sexual Activity Not Currently    Patient's Medications  New Prescriptions   VITAMIN D, ERGOCALCIFEROL, (DRISDOL) 1.25 MG (50000 UT) CAPS CAPSULE    Take one tablet wkly  Previous Medications   FEXOFENADINE (ALLEGRA) 180 MG TABLET    Take 1 tablet (180 mg total) by mouth daily.  FLUTICASONE (FLONASE) 50 MCG/ACT NASAL SPRAY    Place 1 spray into both nostrils 2 (two) times daily. After sinus rinses!   MONTELUKAST (SINGULAIR) 10 MG TABLET    Take 1 tablet (10 mg total) by mouth at bedtime.  Modified Medications   No medications on file  Discontinued Medications   No medications on file    Penicillins  Review of Systems: General:   Denies fever, chills, unexplained weight loss.  Optho/Auditory:   Denies visual changes, blurred vision/LOV Respiratory:   Denies SOB, DOE more than baseline levels.  Cardiovascular:   Denies chest pain, palpitations, new onset peripheral edema  Gastrointestinal:   Denies nausea, vomiting, diarrhea.  Genitourinary: Denies dysuria, freq/ urgency, flank pain or discharge from genitals.  Endocrine:     Denies hot or cold intolerance, polyuria, polydipsia. Musculoskeletal:   Denies unexplained myalgias, joint swelling, unexplained arthralgias, gait problems.  Skin:  Denies rash, suspicious lesions Neurological:     Denies dizziness, unexplained weakness, numbness  Psychiatric/Behavioral:   Denies mood changes, suicidal or homicidal ideations, hallucinations    Objective:     Blood pressure 124/77, pulse 61, temperature 97.9 F (36.6 C), height 5\' 10"  (1.778 m),  weight 135 lb 6.4 oz (61.4 kg), SpO2 99 %. Body mass index is 19.43 kg/m. General Appearance:    Alert, cooperative, no distress, appears stated age  Head:    Normocephalic, without obvious abnormality, atraumatic  Eyes:    PERRL, conjunctiva/corneas clear, EOM's intact, fundi    benign, both eyes  Ears:    Normal TM's and external ear canals, both ears  Nose:   Nares normal, septum midline, mucosa normal, no drainage    or sinus tenderness  Throat:   Lips w/o lesion, mucosa moist, and tongue normal; teeth and   gums normal  Neck:   Supple, symmetrical, trachea midline, no adenopathy;    thyroid:  no enlargement/tenderness/nodules; no carotid   bruit or JVD  Back:     Symmetric, no curvature, ROM normal, no CVA tenderness  Lungs:     Clear to auscultation bilaterally, respirations unlabored, no       Wh/ R/ R  Chest Wall:    No tenderness or gross deformity; normal excursion   Heart:    Regular rate and rhythm, S1 and S2 normal, no murmur, rub   or gallop  Abdomen:     Soft, non-tender, bowel sounds active all four quadrants, NO   G/R/R, no masses, no organomegaly  Genitalia:    Ext genitalia: without lesion, no penile rash or discharge, no hernias appreciated   Rectal:    Normal tone, prostate WNL's and equal b/l, no tenderness; guaiac negative stool  Extremities:   Extremities normal, atraumatic, no cyanosis or gross edema  Pulses:   2+ and symmetric all extremities  Skin:   Warm, dry, Skin color, texture, turgor normal, no obvious rashes or lesions  M-Sk:   Ambulates * 4 w/o difficulty, no gross deformities, tone WNL  Neurologic:   CNII-XII intact, normal strength, sensation and reflexes    Throughout Psych:  No HI/SI, judgement and insight good, Euthymic mood. Full Affect.

## 2018-07-25 NOTE — Patient Instructions (Signed)
 Preventive Care 56 Years and Older, Male Preventive care refers to lifestyle choices and visits with your health care provider that can promote health and wellness. What does preventive care include?   A yearly physical exam. This is also called an annual well check.  Dental exams once or twice a year.  Routine eye exams. Ask your health care provider how often you should have your eyes checked.  Personal lifestyle choices, including: ? Daily care of your teeth and gums. ? Regular physical activity. ? Eating a healthy diet. ? Avoiding tobacco and drug use. ? Limiting alcohol use. ? Practicing safe sex. ? Taking low doses of aspirin every day. ? Taking vitamin and mineral supplements as recommended by your health care provider. What happens during an annual well check? The services and screenings done by your health care provider during your annual well check will depend on your age, overall health, lifestyle risk factors, and family history of disease. Counseling Your health care provider may ask you questions about your:  Alcohol use.  Tobacco use.  Drug use.  Emotional well-being.  Home and relationship well-being.  Sexual activity.  Eating habits.  History of falls.  Memory and ability to understand (cognition).  Work and work environment. Screening You may have the following tests or measurements:  Height, weight, and BMI.  Blood pressure.  Lipid and cholesterol levels. These may be checked every 5 years, or more frequently if you are over 50 years old.  Skin check.  Lung cancer screening. You may have this screening every year starting at age 55 if you have a 30-pack-year history of smoking and currently smoke or have quit within the past 15 years.  Colorectal cancer screening. All adults should have this screening starting at age 50 and continuing until age 75. You will have tests every 1-10 years, depending on your results and the type of screening  test. People at increased risk should start screening at an earlier age. Screening tests may include: ? Guaiac-based fecal occult blood testing. ? Fecal immunochemical test (FIT). ? Stool DNA test. ? Virtual colonoscopy. ? Sigmoidoscopy. During this test, a flexible tube with a tiny camera (sigmoidoscope) is used to examine your rectum and lower colon. The sigmoidoscope is inserted through your anus into your rectum and lower colon. ? Colonoscopy. During this test, a long, thin, flexible tube with a tiny camera (colonoscope) is used to examine your entire colon and rectum.  Prostate cancer screening. Recommendations will vary depending on your family history and other risks.  Hepatitis C blood test.  Hepatitis B blood test.  Sexually transmitted disease (STD) testing.  Diabetes screening. This is done by checking your blood sugar (glucose) after you have not eaten for a while (fasting). You may have this done every 1-3 years.  Abdominal aortic aneurysm (AAA) screening. You may need this if you are a current or former smoker.  Osteoporosis. You may be screened starting at age 70 if you are at high risk. Talk with your health care provider about your test results, treatment options, and if necessary, the need for more tests. Vaccines Your health care provider may recommend certain vaccines, such as:  Influenza vaccine. This is recommended every year.  Tetanus, diphtheria, and acellular pertussis (Tdap, Td) vaccine. You may need a Td booster every 10 years.  Varicella vaccine. You may need this if you have not been vaccinated.  Zoster vaccine. You may need this after age 60.  Measles, mumps, and rubella (MMR)   vaccine. You may need at least one dose of MMR if you were born in 1957 or later. You may also need a second dose.  Pneumococcal 13-valent conjugate (PCV13) vaccine. One dose is recommended after age 24.  Pneumococcal polysaccharide (PPSV23) vaccine. One dose is recommended  after age 30.  Meningococcal vaccine. You may need this if you have certain conditions.  Hepatitis A vaccine. You may need this if you have certain conditions or if you travel or work in places where you may be exposed to hepatitis A.  Hepatitis B vaccine. You may need this if you have certain conditions or if you travel or work in places where you may be exposed to hepatitis B.  Haemophilus influenzae type b (Hib) vaccine. You may need this if you have certain risk factors. Talk to your health care provider about which screenings and vaccines you need and how often you need them. This information is not intended to replace advice given to you by your health care provider. Make sure you discuss any questions you have with your health care provider. Document Released: 01/31/2015 Document Revised: 02/24/2017 Document Reviewed: 11/05/2014 Elsevier Interactive Patient Education  2019 [employer] for Adults, Male A healthy lifestyle and preventive care can promote health and wellness. Preventive health guidelines for men include the following key practices:  A routine yearly physical is a good way to check with your health care provider about your health and preventative screening. It is a chance to share any concerns and updates on your health and to receive a thorough exam.  Visit your dentist for a routine exam and preventative care every 6 months. Brush your teeth twice a day and floss once a day. Good oral hygiene prevents tooth decay and gum disease.  The frequency of eye exams is based on your age, health, family medical history, use of contact lenses, and other factors. Follow your health care provider's recommendations for frequency of eye exams.  Eat a healthy diet. Foods such as vegetables, fruits, whole grains, low-fat dairy products, and lean protein foods contain the nutrients you need without too many calories. Decrease your intake of foods high in  solid fats, added sugars, and salt. Eat the right amount of calories for you. Get information about a proper diet from your health care provider, if necessary.  Regular physical exercise is one of the most important things you can do for your health. Most adults should get at least 150 minutes of moderate-intensity exercise (any activity that increases your heart rate and causes you to sweat) each week. In addition, most adults need muscle-strengthening exercises on 2 or more days a week.  Maintain a healthy weight. The body mass index (BMI) is a screening tool to identify possible weight problems. It provides an estimate of body fat based on height and weight. Your health care provider can find your BMI and can help you achieve or maintain a healthy weight. For adults 20 years and older:  A BMI below 18.5 is considered underweight.  A BMI of 18.5 to 24.9 is normal.  A BMI of 25 to 29.9 is considered overweight.  A BMI of 30 and above is considered obese.  Maintain normal blood lipids and cholesterol levels by exercising and minimizing your intake of saturated fat. Eat a balanced diet with plenty of fruit and vegetables. Blood tests for lipids and cholesterol should begin at age 68 and be repeated every 5 years. If  your lipid or cholesterol levels are high, you are over 50, or you are at high risk for heart disease, you may need your cholesterol levels checked more frequently. Ongoing high lipid and cholesterol levels should be treated with medicines if diet and exercise are not working.  If you smoke, find out from your health care provider how to quit. If you do not use tobacco, do not start.  Lung cancer screening is recommended for adults aged 31-80 years who are at high risk for developing lung cancer because of a history of smoking. A yearly low-dose CT scan of the lungs is recommended for people who have at least a 30-pack-year history of smoking and are a current smoker or have quit within  the past 15 years. A pack year of smoking is smoking an average of 1 pack of cigarettes a day for 1 year (for example: 1 pack a day for 30 years or 2 packs a day for 15 years). Yearly screening should continue until the smoker has stopped smoking for at least 15 years. Yearly screening should be stopped for people who develop a health problem that would prevent them from having lung cancer treatment.  If you choose to drink alcohol, do not have more than 2 drinks per day. One drink is considered to be 12 ounces (355 mL) of beer, 5 ounces (148 mL) of wine, or 1.5 ounces (44 mL) of liquor.  Avoid use of street drugs. Do not share needles with anyone. Ask for help if you need support or instructions about stopping the use of drugs.  High blood pressure causes heart disease and increases the risk of stroke. Your blood pressure should be checked at least every 1-2 years. Ongoing high blood pressure should be treated with medicines, if weight loss and exercise are not effective.  If you are 55-53 years old, ask your health care provider if you should take aspirin to prevent heart disease.  Diabetes screening is done by taking a blood sample to check your blood glucose level after you have not eaten for a certain period of time (fasting). If you are not overweight and you do not have risk factors for diabetes, you should be screened once every 3 years starting at age 75. If you are overweight or obese and you are 32-46 years of age, you should be screened for diabetes every year as part of your cardiovascular risk assessment.  Colorectal cancer can be detected and often prevented. Most routine colorectal cancer screening begins at the age of 64 and continues through age 61. However, your health care provider may recommend screening at an earlier age if you have risk factors for colon cancer. On a yearly basis, your health care provider may provide home test kits to check for hidden blood in the stool. Use of a  small camera at the end of a tube to directly examine the colon (sigmoidoscopy or colonoscopy) can detect the earliest forms of colorectal cancer. Talk to your health care provider about this at age 60, when routine screening begins. Direct exam of the colon should be repeated every 5-10 years through age 56, unless early forms of precancerous polyps or small growths are found.  People who are at an increased risk for hepatitis B should be screened for this virus. You are considered at high risk for hepatitis B if:  You were born in a country where hepatitis B occurs often. Talk with your health care provider about which countries are considered high  a high-risk country and you have not received a shot to protect against hepatitis B (hepatitis B vaccine).  You have HIV or AIDS.  You use needles to inject street drugs.  You live with, or have sex with, someone who has hepatitis B.  You are a man who has sex with other men (MSM).  You get hemodialysis treatment.  You take certain medicines for conditions such as cancer, organ transplantation, and autoimmune conditions.  Hepatitis C blood testing is recommended for all people born from 1945 through 1965 and any individual with known risks for hepatitis C.  Practice safe sex. Use condoms and avoid high-risk sexual practices to reduce the spread of sexually transmitted infections (STIs). STIs include gonorrhea, chlamydia, syphilis, trichomonas, herpes, HPV, and human immunodeficiency virus (HIV). Herpes, HIV, and HPV are viral illnesses that have no cure. They can result in disability, cancer, and death.  If you are a man who has sex with other men, you should be screened at least once per year for:  HIV.  Urethral, rectal, and pharyngeal infection of gonorrhea, chlamydia, or both.  If you are at risk of being infected with HIV, it is recommended that you take a prescription medicine daily to prevent HIV infection. This  is called preexposure prophylaxis (PrEP). You are considered at risk if:  You are a man who has sex with other men (MSM) and have other risk factors.  You are a heterosexual man, are sexually active, and are at increased risk for HIV infection.  You take drugs by injection.  You are sexually active with a partner who has HIV.  Talk with your health care provider about whether you are at high risk of being infected with HIV. If you choose to begin PrEP, you should first be tested for HIV. You should then be tested every 3 months for as long as you are taking PrEP.  A one-time screening for abdominal aortic aneurysm (AAA) and surgical repair of large AAAs by ultrasound are recommended for men ages 65 to 75 years who are current or former smokers.  Healthy men should no longer receive prostate-specific antigen (PSA) blood tests as part of routine cancer screening. Talk with your health care provider about prostate cancer screening.  Testicular cancer screening is not recommended for adult males who have no symptoms. Screening includes self-exam, a health care provider exam, and other screening tests. Consult with your health care provider about any symptoms you have or any concerns you have about testicular cancer.  Use sunscreen. Apply sunscreen liberally and repeatedly throughout the day. You should seek shade when your shadow is shorter than you. Protect yourself by wearing long sleeves, pants, a wide-brimmed hat, and sunglasses year round, whenever you are outdoors.  Once a month, do a whole-body skin exam, using a mirror to look at the skin on your back. Tell your health care provider about new moles, moles that have irregular borders, moles that are larger than a pencil eraser, or moles that have changed in shape or color.  Stay current with required vaccines (immunizations).  Influenza vaccine. All adults should be immunized every year.  Tetanus, diphtheria, and acellular pertussis (Td,  Tdap) vaccine. An adult who has not previously received Tdap or who does not know his vaccine status should receive 1 dose of Tdap. This initial dose should be followed by tetanus and diphtheria toxoids (Td) booster doses every 10 years. Adults with an unknown or incomplete history of completing a 3-dose immunization series with   Td-containing vaccines should begin or complete a primary immunization series including a Tdap dose. Adults should receive a Td booster every 10 years.  Varicella vaccine. An adult without evidence of immunity to varicella should receive 2 doses or a second dose if he has previously received 1 dose.  Human papillomavirus (HPV) vaccine. Males aged 11-21 years who have not received the vaccine previously should receive the 3-dose series. Males aged 22-26 years may be immunized. Immunization is recommended through the age of 26 years for any male who has sex with males and did not get any or all doses earlier. Immunization is recommended for any person with an immunocompromised condition through the age of 26 years if he did not get any or all doses earlier. During the 3-dose series, the second dose should be obtained 4-8 weeks after the first dose. The third dose should be obtained 24 weeks after the first dose and 16 weeks after the second dose.  Zoster vaccine. One dose is recommended for adults aged 60 years or older unless certain conditions are present.  Measles, mumps, and rubella (MMR) vaccine. Adults born before 1957 generally are considered immune to measles and mumps. Adults born in 1957 or later should have 1 or more doses of MMR vaccine unless there is a contraindication to the vaccine or there is laboratory evidence of immunity to each of the three diseases. A routine second dose of MMR vaccine should be obtained at least 28 days after the first dose for students attending postsecondary schools, health care workers, or international travelers. People who received  inactivated measles vaccine or an unknown type of measles vaccine during 1963-1967 should receive 2 doses of MMR vaccine. People who received inactivated mumps vaccine or an unknown type of mumps vaccine before 1979 and are at high risk for mumps infection should consider immunization with 2 doses of MMR vaccine. Unvaccinated health care workers born before 1957 who lack laboratory evidence of measles, mumps, or rubella immunity or laboratory confirmation of disease should consider measles and mumps immunization with 2 doses of MMR vaccine or rubella immunization with 1 dose of MMR vaccine.  Pneumococcal 13-valent conjugate (PCV13) vaccine. When indicated, a person who is uncertain of his immunization history and has no record of immunization should receive the PCV13 vaccine. All adults 56 years of age and older should receive this vaccine. An adult aged 19 years or older who has certain medical conditions and has not been previously immunized should receive 1 dose of PCV13 vaccine. This PCV13 should be followed with a dose of pneumococcal polysaccharide (PPSV23) vaccine. Adults who are at high risk for pneumococcal disease should obtain the PPSV23 vaccine at least 8 weeks after the dose of PCV13 vaccine. Adults older than 56 years of age who have normal immune system function should obtain the PPSV23 vaccine dose at least 1 year after the dose of PCV13 vaccine.  Pneumococcal polysaccharide (PPSV23) vaccine. When PCV13 is also indicated, PCV13 should be obtained first. All adults aged 56 years and older should be immunized. An adult younger than age 56 years who has certain medical conditions should be immunized. Any person who resides in a nursing home or long-term care facility should be immunized. An adult smoker should be immunized. People with an immunocompromised condition and certain other conditions should receive both PCV13 and PPSV23 vaccines. People with human immunodeficiency virus (HIV) infection  should be immunized as soon as possible after diagnosis. Immunization during chemotherapy or radiation therapy should be avoided. Routine   use of PPSV23 vaccine is not recommended for American Indians, [place] Natives, or people younger than 65 years unless there are medical conditions that require PPSV23 vaccine. When indicated, people who have unknown immunization and have no record of immunization should receive PPSV23 vaccine. One-time revaccination 5 years after the first dose of PPSV23 is recommended for people aged 19-64 years who have chronic kidney failure, nephrotic syndrome, asplenia, or immunocompromised conditions. People who received 1-2 doses of PPSV23 before age 56 years should receive another dose of PPSV23 vaccine at age 56 years or later if at least 5 years have passed since the previous dose. Doses of PPSV23 are not needed for people immunized with PPSV23 at or after age 56 years.  Meningococcal vaccine. Adults with asplenia or persistent complement component deficiencies should receive 2 doses of quadrivalent meningococcal conjugate (MenACWY-D) vaccine. The doses should be obtained at least 2 months apart. Microbiologists working with certain meningococcal bacteria, military recruits, people at risk during an outbreak, and people who travel to or live in countries with a high rate of meningitis should be immunized. A first-year college student up through age 21 years who is living in a residence hall should receive a dose if he did not receive a dose on or after his 16th birthday. Adults who have certain high-risk conditions should receive one or more doses of vaccine.  Hepatitis A vaccine. Adults who wish to be protected from this disease, have chronic liver disease, work with hepatitis A-infected animals, work in hepatitis A research labs, or travel to or work in countries with a high rate of hepatitis A should be immunized. Adults who were previously unvaccinated and who anticipate close  contact with an international adoptee during the first 60 days after arrival in the [place] from a country with a high rate of hepatitis A should be immunized.  Hepatitis B vaccine. Adults should be immunized if they wish to be protected from this disease, are under age 59 years and have diabetes, have chronic liver disease, have had more than one sex partner in the past 6 months, may be exposed to blood or other infectious body fluids, are household contacts or sex partners of hepatitis B positive people, are clients or workers in certain care facilities, or travel to or work in countries with a high rate of hepatitis B.  Haemophilus influenzae type b (Hib) vaccine. A previously unvaccinated person with asplenia or sickle cell disease or having a scheduled splenectomy should receive 1 dose of Hib vaccine. Regardless of previous immunization, a recipient of a hematopoietic stem cell transplant should receive a 3-dose series 6-12 months after his successful transplant. Hib vaccine is not recommended for adults with HIV infection. Preventive Service / Frequency Ages 19 to 39  Blood pressure check.** / Every 3-5 years.  Lipid and cholesterol check.** / Every 5 years beginning at age 20.  Hepatitis C blood test.** / For any individual with known risks for hepatitis C.  Skin self-exam. / Monthly.  Influenza vaccine. / Every year.  Tetanus, diphtheria, and acellular pertussis (Tdap, Td) vaccine.** / Consult your health care provider. 1 dose of Td every 10 years.  Varicella vaccine.** / Consult your health care provider.  HPV vaccine. / 3 doses over 6 months, if 26 or younger.  Measles, mumps, rubella (MMR) vaccine.** / You need at least 1 dose of MMR if you were born in 1957 or later. You may also need a second dose.  Pneumococcal 13-valent conjugate (PCV13) vaccine.** /   Pneumococcal 13-valent conjugate (PCV13) vaccine.** / Consult your health care provider.  Pneumococcal polysaccharide (PPSV23) vaccine.** / 1 to 2 doses  if you smoke cigarettes or if you have certain conditions.  Meningococcal vaccine.** / 1 dose if you are age 26 to 70 years and a Market researcher living in a residence hall, or have one of several medical conditions. You may also need additional booster doses.  Hepatitis A vaccine.** / Consult your health care provider.  Hepatitis B vaccine.** / Consult your health care provider.  Haemophilus influenzae type b (Hib) vaccine.** / Consult your health care provider. Ages 46 to 61  Blood pressure check.** / Every year.  Lipid and cholesterol check.** / Every 5 years beginning at age 52.  Lung cancer screening. / Every year if you are aged 43-80 years and have a 30-pack-year history of smoking and currently smoke or have quit within the past 15 years. Yearly screening is stopped once you have quit smoking for at least 15 years or develop a health problem that would prevent you from having lung cancer treatment.  Fecal occult blood test (FOBT) of stool. / Every year beginning at age 70 and continuing until age 42. You may not have to do this test if you get a colonoscopy every 10 years.  Flexible sigmoidoscopy** or colonoscopy.** / Every 5 years for a flexible sigmoidoscopy or every 10 years for a colonoscopy beginning at age 33 and continuing until age 73.  Hepatitis C blood test.** / For all people born from [birth year] through 1965 and any individual with known risks for hepatitis C.  Skin self-exam. / Monthly.  Influenza vaccine. / Every year.  Tetanus, diphtheria, and acellular pertussis (Tdap/Td) vaccine.** / Consult your health care provider. 1 dose of Td every 10 years.  Varicella vaccine.** / Consult your health care provider.  Zoster vaccine.** / 1 dose for adults aged 74 years or older.  Measles, mumps, rubella (MMR) vaccine.** / You need at least 1 dose of MMR if you were born in 1957 or later. You may also need a second dose.  Pneumococcal 13-valent conjugate (PCV13)  vaccine.** / Consult your health care provider.  Pneumococcal polysaccharide (PPSV23) vaccine.** / 1 to 2 doses if you smoke cigarettes or if you have certain conditions.  Meningococcal vaccine.** / Consult your health care provider.  Hepatitis A vaccine.** / Consult your health care provider.  Hepatitis B vaccine.** / Consult your health care provider.  Haemophilus influenzae type b (Hib) vaccine.** / Consult your health care provider. Ages 60 and over  Blood pressure check.** / Every year.  Lipid and cholesterol check.**/ Every 5 years beginning at age 23.  Lung cancer screening. / Every year if you are aged 2-80 years and have a 30-pack-year history of smoking and currently smoke or have quit within the past 15 years. Yearly screening is stopped once you have quit smoking for at least 15 years or develop a health problem that would prevent you from having lung cancer treatment.  Fecal occult blood test (FOBT) of stool. / Every year beginning at age 109 and continuing until age 52. You may not have to do this test if you get a colonoscopy every 10 years.  Flexible sigmoidoscopy** or colonoscopy.** / Every 5 years for a flexible sigmoidoscopy or every 10 years for a colonoscopy beginning at age 51 and continuing until age 75.  Hepatitis C blood test.** / For all people born from [birth year] through 1963 and any individual with known risks  aneurysm (AAA) screening.** / A one-time screening for ages 65 to 75 years who are current or former smokers.  Skin self-exam. / Monthly.  Influenza vaccine. / Every year.  Tetanus, diphtheria, and acellular pertussis (Tdap/Td) vaccine.** / 1 dose of Td every 10 years.  Varicella vaccine.** / Consult your health care provider.  Zoster vaccine.** / 1 dose for adults aged 60 years or older.  Pneumococcal 13-valent conjugate (PCV13) vaccine.** / 1 dose for all adults aged 56 years and older.  Pneumococcal polysaccharide (PPSV23)  vaccine.** / 1 dose for all adults aged 56 years and older.  Meningococcal vaccine.** / Consult your health care provider.  Hepatitis A vaccine.** / Consult your health care provider.  Hepatitis B vaccine.** / Consult your health care provider.  Haemophilus influenzae type b (Hib) vaccine.** / Consult your health care provider. **Family history and personal history of risk and conditions may change your health care provider's recommendations.   This information is not intended to replace advice given to you by your health care provider. Make sure you discuss any questions you have with your health care provider.   Document Released: 03/02/2001 Document Revised: 01/25/2014 Document Reviewed: 06/01/2010 Elsevier Interactive Patient Education 2016 Elsevier Inc. 

## 2018-07-26 ENCOUNTER — Telehealth: Payer: Self-pay | Admitting: Gastroenterology

## 2018-07-26 LAB — COMPREHENSIVE METABOLIC PANEL
ALT: 27 IU/L (ref 0–44)
AST: 24 IU/L (ref 0–40)
Albumin/Globulin Ratio: 1.7 (ref 1.2–2.2)
Albumin: 4.3 g/dL (ref 3.8–4.9)
Alkaline Phosphatase: 55 IU/L (ref 39–117)
BUN/Creatinine Ratio: 12 (ref 9–20)
BUN: 11 mg/dL (ref 6–24)
Bilirubin Total: 0.4 mg/dL (ref 0.0–1.2)
CO2: 23 mmol/L (ref 20–29)
Calcium: 9.3 mg/dL (ref 8.7–10.2)
Chloride: 102 mmol/L (ref 96–106)
Creatinine, Ser: 0.89 mg/dL (ref 0.76–1.27)
GFR calc Af Amer: 110 mL/min/{1.73_m2} (ref >59)
GFR calc non Af Amer: 96 mL/min/{1.73_m2} (ref >59)
Globulin, Total: 2.5 g/dL (ref 1.5–4.5)
Glucose: 90 mg/dL (ref 65–99)
Potassium: 4.7 mmol/L (ref 3.5–5.2)
Sodium: 139 mmol/L (ref 134–144)
Total Protein: 6.8 g/dL (ref 6.0–8.5)

## 2018-07-26 LAB — CBC WITH DIFFERENTIAL/PLATELET
Basophils Absolute: 0.1 10*3/uL (ref 0.0–0.2)
Basos: 1 %
EOS (ABSOLUTE): 0.5 10*3/uL — ABNORMAL HIGH (ref 0.0–0.4)
Eos: 9 %
Hematocrit: 40.7 % (ref 37.5–51.0)
Hemoglobin: 14.3 g/dL (ref 13.0–17.7)
Immature Grans (Abs): 0 10*3/uL (ref 0.0–0.1)
Immature Granulocytes: 0 %
Lymphocytes Absolute: 2.3 10*3/uL (ref 0.7–3.1)
Lymphs: 43 %
MCH: 30.7 pg (ref 26.6–33.0)
MCHC: 35.1 g/dL (ref 31.5–35.7)
MCV: 87 fL (ref 79–97)
Monocytes Absolute: 0.6 10*3/uL (ref 0.1–0.9)
Monocytes: 11 %
Neutrophils Absolute: 1.9 10*3/uL (ref 1.4–7.0)
Neutrophils: 36 %
Platelets: 257 10*3/uL (ref 150–450)
RBC: 4.66 x10E6/uL (ref 4.14–5.80)
RDW: 12.3 % (ref 11.6–15.4)
WBC: 5.3 10*3/uL (ref 3.4–10.8)

## 2018-07-26 LAB — LIPID PANEL
Chol/HDL Ratio: 2.5 ratio (ref 0.0–5.0)
Cholesterol, Total: 147 mg/dL (ref 100–199)
HDL: 58 mg/dL (ref >39)
LDL Calculated: 82 mg/dL (ref 0–99)
Triglycerides: 34 mg/dL (ref 0–149)
VLDL Cholesterol Cal: 7 mg/dL (ref 5–40)

## 2018-07-26 LAB — T3: T3, Total: 117 ng/dL (ref 71–180)

## 2018-07-26 LAB — HEMOGLOBIN A1C
Est. average glucose Bld gHb Est-mCnc: 103 mg/dL
Hgb A1c MFr Bld: 5.2 % (ref 4.8–5.6)

## 2018-07-26 LAB — T4, FREE: Free T4: 1.19 ng/dL (ref 0.82–1.77)

## 2018-07-26 LAB — VITAMIN D 25 HYDROXY (VIT D DEFICIENCY, FRACTURES): Vit D, 25-Hydroxy: 33.5 ng/mL (ref 30.0–100.0)

## 2018-07-26 LAB — TSH: TSH: 1.52 u[IU]/mL (ref 0.450–4.500)

## 2018-07-26 NOTE — Telephone Encounter (Signed)
Zachary Higgins Can you print out the last OV/colon report/biopsies from our old system and let me know. Thanks RG

## 2018-07-26 NOTE — Telephone Encounter (Signed)
Please review and advise.

## 2018-07-27 NOTE — Telephone Encounter (Signed)
This is on your desk for review.

## 2018-07-28 NOTE — Telephone Encounter (Signed)
Colonoscopy report reviewed 10/2013 ([facility])-colonic polyp status post polypectomy  Plan: Repeat colonoscopy 10/2018. Please inform and put him on recall.  thx  RG.

## 2018-07-31 NOTE — Telephone Encounter (Signed)
Left message for patient to call back  

## 2018-07-31 NOTE — Telephone Encounter (Signed)
Patient's wife returned call to the office-Bonnie-verified DPR-Bonnie was informed of recall information and recall placed in Epic per MD request; Zachary Higgins verbalized understanding of information/instructions; Zachary Higgins was advised to call back to the office should questions/concerns arise;

## 2018-08-05 LAB — HIV ANTIBODY (ROUTINE TESTING W REFLEX)

## 2018-08-05 LAB — SPECIMEN STATUS REPORT

## 2018-08-05 LAB — PHOSPHORUS: Phosphorus: 2.9 mg/dL (ref 2.8–4.1)

## 2018-08-05 LAB — HEPATITIS C ANTIBODY: Hep C Virus Ab: <0.1 s/co ratio (ref 0.0–0.9)

## 2018-08-05 LAB — MAGNESIUM: Magnesium: 2 mg/dL (ref 1.6–2.3)

## 2018-08-08 ENCOUNTER — Encounter: Payer: Self-pay | Admitting: Radiology

## 2018-08-09 ENCOUNTER — Telehealth: Payer: Self-pay | Admitting: Family Medicine

## 2018-08-09 NOTE — Telephone Encounter (Signed)
Noelle @ [employer] we need to call them by 4:30 with Authorization  No# for pt's Ct Scan tomorrow or it needs to be cancelled by 4:30 pm today!!  --Fprwarding urgent message to medical assistant to call Noella @ [phone] asap.  --glh

## 2018-08-09 NOTE — Telephone Encounter (Signed)
Called and left message with the auth #. [id number], CMA/RT(R)

## 2018-08-10 ENCOUNTER — Ambulatory Visit
Admission: RE | Admit: 2018-08-10 | Discharge: 2018-08-10 | Disposition: A | Discharge status: Home / Self Care | Payer: Commercial Managed Care - PPO | Source: Ambulatory Visit | Attending: Family Medicine | Admitting: Family Medicine

## 2018-08-10 DIAGNOSIS — Z122 Encounter for screening for malignant neoplasm of respiratory organs: Secondary | ICD-10-CM

## 2018-08-15 ENCOUNTER — Encounter: Payer: Self-pay | Admitting: Family Medicine

## 2018-08-15 ENCOUNTER — Ambulatory Visit (INDEPENDENT_AMBULATORY_CARE_PROVIDER_SITE_OTHER): Payer: Commercial Managed Care - PPO | Admitting: Family Medicine

## 2018-08-15 ENCOUNTER — Other Ambulatory Visit: Payer: Self-pay

## 2018-08-15 VITALS — BP 131/80 | Temp 98.5°F | Ht 70.0 in | Wt 141.0 lb

## 2018-08-15 DIAGNOSIS — M1990 Unspecified osteoarthritis, unspecified site: Secondary | ICD-10-CM

## 2018-08-15 DIAGNOSIS — E559 Vitamin D deficiency, unspecified: Secondary | ICD-10-CM | POA: Diagnosis not present

## 2018-08-15 DIAGNOSIS — R030 Elevated blood-pressure reading, without diagnosis of hypertension: Secondary | ICD-10-CM

## 2018-08-15 DIAGNOSIS — Z87891 Personal history of nicotine dependence: Secondary | ICD-10-CM | POA: Diagnosis not present

## 2018-08-15 DIAGNOSIS — J439 Emphysema, unspecified: Secondary | ICD-10-CM | POA: Diagnosis not present

## 2018-08-15 DIAGNOSIS — R911 Solitary pulmonary nodule: Secondary | ICD-10-CM

## 2018-08-15 MED ORDER — VITAMIN D (ERGOCALCIFEROL) 1.25 MG (50000 UNIT) PO CAPS: ORAL_CAPSULE | ORAL | 3 refills | Status: DC

## 2018-08-15 NOTE — Progress Notes (Signed)
Assessment and plan:  1. Pulmonary nodule, left- upper post lobe- 6.5 mm in 07/2018   2. Stopped smoking with greater than 30 pack year history   3. Pulmonary emphysema, unspecified emphysema type ([place])-  seen on screening CT   4. Vitamin D insufficiency   5. Elevated blood pressure reading   6. Osteoarthritis, unspecified osteoarthritis type, unspecified site     - Patient recently had labs done and a CT lung screen (due to history of smoking) early in the month when we did a complete physical exam on him.   - Reviewed recent lab work (07/25/2018) in depth with patient today.  All lab work within normal limits unless otherwise noted.  Extensive education was provided and all questions were answered.  - Renal function, liver enzymes, and electrolytes WNL.  - TSH and free T3, T4 WNL.  1. Vitamin D Insufficiency - 33.5 - Recommended beginning once weekly Vitamin D tablet. - Explained the production of Vitamin D in the body and its importance to general health. - Will continue to monitor.  2. Lipid Panel Review - Cholesterol WNL - Educated patient regarding prudent dietary habits, such as low saturated & trans fat and low carb/ diets.  Encouraged regular exercise and physical conditioning to maintain and improve cholesterol values.  - Need for yearly follow-up.  - Educational handouts provided at patient's desire. - Will continue to monitor.  3. HbA1c WNL - 5.2 - Patient reports family history of diabetes. - A1c WNL. - Will continue to monitor.  4. Arthritis and Chronic Aches / Pains - Advised patient that his symptoms are likely age-related due to arthritis.  - Encouraged patient to begin wearing supportive shoes daily.  - Discussed possibility of referral to podiatry for support.  - Advised patient to minimize repetitive actions as much as possible, and to walk and exercise beyond standing still on his  feet all day at work.  - Advised prudent use of tylenol to alleviate pain PRN.  - Handout on arthritis provided today for patient's education.  - Advised patient to continue working toward exercising to improve overall mental, physical, and emotional health.    - Encouraged patient to engage in daily physical activity as tolerated, especially a formal exercise routine.  Recommended that the patient eventually strive for at least 150 minutes of moderate cardiovascular activity per week according to guidelines established by the [facility].   - Patient should also continue to consume adequate amounts of water.  5. History of Elevated Blood Pressure Reading - Resolved now. - Will continue to monitor.  6. Pulmonary Nodule of Posterior Left Upper Lobe, 6.5 mm; Pulmonary Emphysema  - Patient with >30 pack year history of smoking; quit around 2014. - Educated patient that he needs to be done with smoking for 15 years before he quits CT scan screening.  - Independent visualization and interpretation of image(s), tracing(s), or specimen(s) done by me today at point of care delivery; directly influenced my plan of care for patient.  Explained findings of recent lung CT imaging (08/11/2018) with patient in detail.  All questions were answered.  CT SCAN FINDINGS CLINICAL DATA:  58 year old male with 36 pack-year history of smoking. Lung cancer screening.  EXAM: CT CHEST WITHOUT CONTRAST LOW-DOSE FOR LUNG CANCER SCREENING  TECHNIQUE: Multidetector CT imaging of the chest was performed following the standard protocol without IV contrast.  COMPARISON:  None.  FINDINGS: Cardiovascular: The heart size is normal. No substantial pericardial effusion.  No thoracic aortic aneurysm.  Mediastinum/Nodes: No mediastinal lymphadenopathy. No evidence for gross hilar lymphadenopathy although assessment is limited by the lack of intravenous contrast on today's study. The esophagus has normal imaging  features. There is no axillary lymphadenopathy.  Lungs/Pleura: Centrilobular and paraseptal emphysema evident. Areas of tree-in-bud nodularity are seen in the lungs bilaterally, likely reflecting sequelae of atypical infection. Pleuroparenchymal scarring noted in both lung apices. 6.5 mm nodule in the posterior left upper lobe may be related to scar. Several additional tiny scattered pulmonary nodules evident. No focal consolidation. No pleural effusion.  Upper Abdomen: Unremarkable.  Musculoskeletal: No worrisome lytic or sclerotic osseous abnormality.  IMPRESSION: 1. Lung-RADS 3, probably benign findings. Short-term follow-up in 6 months is recommended with repeat low-dose chest CT without contrast (please use the following order, "CT CHEST LCS NODULE FOLLOW-UP W/O CM"). 2. Scattered areas of tree-in-bud nodularity likely reflecting sequelae of atypical infection. 3.  Emphysema. (FBP10-C58.9)  Electronically Signed   By: Misty Stanley M.D.   On: 08/11/2018 08:06  - Educated patient about importance of short-term follow-up as recommended.  Asked CMA to place this future order and pt to put on his calendar so he will call to schedule  - Continue to follow-up for yearly and chronic health maintenance.   Education and routine counseling performed. Handouts provided.  Pt was interviewed and evaluated by me in the clinic today for 32.5+ minutes, with over 50% time spent in face to face counseling of patients various medical conditions, treatment plans of those medical conditions including medicine management and lifestyle modification, strategies to improve health and well being; and in coordination of care. SEE ABOVE TREATMENT PLAN FOR DETAILS  Meds ordered this encounter  Medications   Vitamin D, Ergocalciferol, (DRISDOL) 1.25 MG (50000 UT) CAPS capsule    Sig: Take one tablet wkly    Dispense:  12 capsule    Refill:  3    Return for f/up 4-6 months.   Anticipatory  guidance and routine counseling done re: condition, txmnt options and need for follow up. All questions of patient's were answered. Pt was interviewed and evaluated by me in the clinic today for 32.5+ minutes, with over 50% time spent in face to face counseling of patients various medical conditions, treatment plans of those medical conditions including medicine management and lifestyle modification, strategies to improve health and well being; and in coordination of care. SEE ABOVE TREATMENT PLAN FOR DETAILS  Gross side effects, risk and benefits, and alternatives of medications discussed with patient.  Patient is aware that all medications have potential side effects and we are unable to predict every sideeffect or drug-drug interaction that may occur.  Expresses verbal understanding and consents to current therapy plan and treatment regiment.  Please see AVS handed out to patient at the end of our visit for additional patient instructions/ counseling done pertaining to today's office visit.  Note:  This document was prepared using Dragon voice recognition software and may include unintentional dictation errors.  This document serves as a record of services personally performed by Mellody Dance, DO. It was created on her behalf by Toni Amend, a trained medical scribe. The creation of this record is based on the scribe's personal observations and the provider's statements to them.   I have reviewed the above medical documentation for accuracy and completeness and I concur.  Mellody Dance, DO 08/15/2018 8:08 PM      ----------------------------------------------------------------------------------------------------------------------  Subjective:   CC:   Zachary Higgins is a  58 y.o. male who presents to [place] at [facility] today for review and discussion of recent bloodwork that was done in addition to f/up on chronic conditions we are managing for pt.  1. All  recent blood work that we ordered was reviewed with patient today.  Patient was counseled on all abnormalities and we discussed dietary and lifestyle changes that could help those values (also medications when appropriate).  Extensive health counseling performed and all patient's concerns/ questions were addressed.  See labs below and also plan for more details of these abnormalities   Patient reports that he quit smoking around six years ago.  States that he does have family history of smoking and lung cancer, specifically mentioning his mother.  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  Sometimes he is worried if he has a circulatory issue with his legs.  States that his calves and ankles "ache bad," bilaterally, especially in the morning.  "When I get up and put weight on my feet, you have to walk around for 5-10 minutes until everything loosens up."  He states sometimes they feel tight like they're "swelling up," and sometimes he also experiences this in his hands.   Wt Readings from Last 3 Encounters:  08/15/18 141 lb (64 kg)  07/25/18 135 lb 6.4 oz (61.4 kg)  03/23/18 141 lb 4.8 oz (64.1 kg)   BP Readings from Last 3 Encounters:  08/15/18 131/80  07/25/18 124/77  03/23/18 135/90   Pulse Readings from Last 3 Encounters:  07/25/18 61  03/23/18 66  02/27/18 68   BMI Readings from Last 3 Encounters:  08/15/18 20.23 kg/m  07/25/18 19.43 kg/m  03/23/18 20.27 kg/m     Patient Care Team    Relationship Specialty Notifications Start End  Mellody Dance, DO PCP - General Family Medicine  02/27/18   Jackquline Denmark, MD Consulting Physician Gastroenterology  02/27/18     Full medical history updated and reviewed in the office today  Patient Active Problem List   Diagnosis Date Noted   Osteoarthritis 09/03/2018   Stopped smoking with greater than 30 pack year history 08/15/2018   Pulmonary nodule, left- upper post lobe- 6.5 mm in 07/2018 08/15/2018     Emphysema of lung ([place])- ct scan findings 08/15/2018   Vitamin D insufficiency 08/15/2018   Actinic keratosis due to exposure to sunlight 07/25/2018   Atypical nevi 07/25/2018   Nonintractable episodic headache 02/27/2018   Caffeine abuse, continuous ([place]) 02/27/2018   Chronic pansinusitis 02/27/2018   Environmental and seasonal allergies 02/27/2018   At high risk for dehydration 02/27/2018   Elevated blood pressure reading 02/27/2018    History reviewed. No pertinent past medical history.  History reviewed. No pertinent surgical history.  Social History   Tobacco Use   Smoking status: Former Smoker    Packs/day: 1.00    Types: Cigarettes    Quit date: 2015    Years since quitting: 5.6   Smokeless tobacco: Former Systems developer  Substance Use Topics   Alcohol use: Not Currently    Family Hx: Family History  Problem Relation Age of Onset   Diabetes Maternal Uncle    Diabetes Paternal Uncle      Medications: Current Outpatient Medications  Medication Sig Dispense Refill   fexofenadine (ALLEGRA) 180 MG tablet Take 1 tablet (180 mg total) by mouth daily. 90 tablet 1   fluticasone (FLONASE) 50 MCG/ACT nasal spray Place 1 spray into both nostrils 2 (  two) times daily. After sinus rinses! 1 g 2   montelukast (SINGULAIR) 10 MG tablet Take 1 tablet (10 mg total) by mouth at bedtime. 90 tablet 1   Vitamin D, Ergocalciferol, (DRISDOL) 1.25 MG (50000 UT) CAPS capsule Take one tablet wkly 12 capsule 3   No current facility-administered medications for this visit.     Allergies:  Allergies  Allergen Reactions   Penicillins Anaphylaxis     Review of Systems: General:   No F/C, wt loss Pulm:   No DIB, SOB, pleuritic chest pain Card:  No CP, palpitations Abd:  No n/v/d or pain Ext:  No inc edema from baseline  Objective:  Blood pressure 131/80, temperature 98.5 F (36.9 C), height 5\' 10"  (1.778 m), weight 141 lb (64 kg), SpO2 98 %. Body mass index is 20.23  kg/m. Gen:   Well NAD, A and O *3 HEENT:    [place]/AT, EOMI,  MMM Lungs:   Normal work of breathing. CTA B/L, no Wh, rhonchi Heart:   RRR, S1, S2 WNL's, no MRG Abd:   No gross distention Exts:    warm, pink,  Brisk capillary refill, warm and well perfused.  Psych:    No HI/SI, judgement and insight good, Euthymic mood. Full Affect.   Recent Results (from the past 2160 hour(s))  VITAMIN D 25 Hydroxy (Vit-D Deficiency, Fractures)     Status: None   Collection Time: 07/25/18  9:20 AM  Result Value Ref Range   Vit D, 25-Hydroxy 33.5 30.0 - 100.0 ng/mL    Comment: Vitamin D deficiency has been defined by the [place] practice guideline as a level of serum 25-OH vitamin D less than 20 ng/mL (1,2). The Endocrine Society went on to further define vitamin D insufficiency as a level between 21 and 29 ng/mL (2). 1. IOM (Institute of Medicine). 2010. Dietary reference    intakes for calcium and D. [place]: The    [employer]. 2. Holick MF, Binkley Sutton, Bischoff-Ferrari HA, et al.    Evaluation, treatment, and prevention of vitamin D    deficiency: an Endocrine Society clinical practice    guideline. JCEM. 2011 Jul; 96(7):1911-30.   TSH     Status: None   Collection Time: 07/25/18  9:20 AM  Result Value Ref Range   TSH 1.520 0.450 - 4.500 uIU/mL  Lipid panel     Status: None   Collection Time: 07/25/18  9:20 AM  Result Value Ref Range   Cholesterol, Total 147 100 - 199 mg/dL   Triglycerides 34 0 - 149 mg/dL   HDL 58 >39 mg/dL   VLDL Cholesterol Cal 7 5 - 40 mg/dL   LDL Calculated 82 0 - 99 mg/dL   Chol/HDL Ratio 2.5 0.0 - 5.0 ratio    Comment:                                   T. Chol/HDL Ratio                                             Men  Women                               1/2 Avg.Risk  3.4    3.3                                   Avg.Risk  5.0    4.4                                2X Avg.Risk  9.6    7.1                                 3X Avg.Risk 23.4   11.0   Hemoglobin A1c     Status: None   Collection Time: 07/25/18  9:20 AM  Result Value Ref Range   Hgb A1c MFr Bld 5.2 4.8 - 5.6 %    Comment:          Prediabetes: 5.7 - 6.4          Diabetes: >6.4          Glycemic control for adults with diabetes: <7.0    Est. average glucose Bld gHb Est-mCnc 103 mg/dL  Comprehensive metabolic panel     Status: None   Collection Time: 07/25/18  9:20 AM  Result Value Ref Range   Glucose 90 65 - 99 mg/dL   BUN 11 6 - 24 mg/dL   Creatinine, Ser 0.89 0.76 - 1.27 mg/dL   GFR calc non Af Amer 96 >59 mL/min/1.73   GFR calc Af Amer 110 >59 mL/min/1.73   BUN/Creatinine Ratio 12 9 - 20   Sodium 139 134 - 144 mmol/L   Potassium 4.7 3.5 - 5.2 mmol/L   Chloride 102 96 - 106 mmol/L   CO2 23 20 - 29 mmol/L   Calcium 9.3 8.7 - 10.2 mg/dL   Total Protein 6.8 6.0 - 8.5 g/dL   Albumin 4.3 3.8 - 4.9 g/dL   Globulin, Total 2.5 1.5 - 4.5 g/dL   Albumin/Globulin Ratio 1.7 1.2 - 2.2   Bilirubin Total 0.4 0.0 - 1.2 mg/dL   Alkaline Phosphatase 55 39 - 117 IU/L   AST 24 0 - 40 IU/L   ALT 27 0 - 44 IU/L  CBC with Differential/Platelet     Status: Abnormal   Collection Time: 07/25/18  9:20 AM  Result Value Ref Range   WBC 5.3 3.4 - 10.8 x10E3/uL   RBC 4.66 4.14 - 5.80 x10E6/uL   Hemoglobin 14.3 13.0 - 17.7 g/dL   Hematocrit 40.7 37.5 - 51.0 %   MCV 87 79 - 97 fL   MCH 30.7 26.6 - 33.0 pg   MCHC 35.1 31.5 - 35.7 g/dL   RDW 12.3 11.6 - 15.4 %   Platelets 257 150 - 450 x10E3/uL   Neutrophils 36 Not Estab. %   Lymphs 43 Not Estab. %   Monocytes 11 Not Estab. %   Eos 9 Not Estab. %   Basos 1 Not Estab. %   Neutrophils Absolute 1.9 1.4 - 7.0 x10E3/uL   Lymphocytes Absolute 2.3 0.7 - 3.1 x10E3/uL   Monocytes Absolute 0.6 0.1 - 0.9 x10E3/uL   EOS (ABSOLUTE) 0.5 (H) 0.0 - 0.4 x10E3/uL   Basophils Absolute 0.1 0.0 - 0.2 x10E3/uL   Immature Granulocytes 0 Not Estab. %   Immature Grans (Abs) 0.0 0.0 - 0.1 x10E3/uL  T3     Status:  None  Collection Time: 07/25/18  9:20 AM  Result Value Ref Range   T3, Total 117 71 - 180 ng/dL  T4, free     Status: None   Collection Time: 07/25/18  9:20 AM  Result Value Ref Range   Free T4 1.19 0.82 - 1.77 ng/dL  HIV Antibody (routine testing w rflx)     Status: None   Collection Time: 07/25/18  9:20 AM  Result Value Ref Range   HIV Screen 4th Generation wRfx CANCELED     Comment: We have received your request for additional testing, however we are unable to add the test you requested.  The following test(s) were not performed:  Result canceled by the ancillary.   Hepatitis C antibody     Status: None   Collection Time: 07/25/18  9:20 AM  Result Value Ref Range   Hep C Virus Ab <0.1 0.0 - 0.9 s/co ratio    Comment:                                   Negative:     < 0.8                              Indeterminate: 0.8 - 0.9                                   Positive:     > 0.9  The CDC recommends that a positive HCV antibody result  be followed up with a HCV Nucleic Acid Amplification  test (030131).   Phosphorus     Status: None   Collection Time: 07/25/18  9:20 AM  Result Value Ref Range   Phosphorus 2.9 2.8 - 4.1 mg/dL  Magnesium     Status: None   Collection Time: 07/25/18  9:20 AM  Result Value Ref Range   Magnesium 2.0 1.6 - 2.3 mg/dL  Specimen status report     Status: None   Collection Time: 07/25/18  9:20 AM  Result Value Ref Range   specimen status report Comment     Comment: Written Authorization Written Authorization Written Authorization Received. Authorization received from [place] 08-04-2018 Logged by Gelene Mink

## 2018-08-15 NOTE — Patient Instructions (Signed)
Arthritis Arthritis is a term that is commonly used to refer to joint pain or joint disease. There are more than 100 types of arthritis. What are the causes? The most common cause of this condition is wear and tear of a joint. Other causes include:  Gout.  Inflammation of a joint.  An infection of a joint.  Sprains and other injuries near the joint.  A reaction to medicines or drugs, or an allergic reaction. In some cases, the cause may not be known. What are the signs or symptoms? The main symptom of this condition is pain in the joint during movement. Other symptoms include:  Redness, swelling, or stiffness at a joint.  Warmth coming from the joint.  Fever.  Overall feeling of illness. How is this diagnosed? This condition may be diagnosed with a physical exam and tests, including:  Blood tests.  Urine tests.  Imaging tests, such as X-rays, an MRI, or a CT scan. Sometimes, fluid is removed from a joint for testing. How is this treated? This condition may be treated with:  Treatment of the cause, if it is known.  Rest.  Raising (elevating) the joint.  Applying cold or hot packs to the joint.  Medicines to improve symptoms and reduce inflammation.  Injections of a steroid such as cortisone into the joint to help reduce pain and inflammation. Depending on the cause of your arthritis, you may need to make lifestyle changes to reduce stress on your joint. Changes may include:  Exercising more.  Losing weight. Follow these instructions at home: Medicines  Take over-the-counter and prescription medicines only as told by your health care provider.  Do not take aspirin to relieve pain if your health care provider thinks that gout may be causing your pain. Activity  Rest your joint if told by your health care provider. Rest is important when your disease is active and your joint feels painful, swollen, or stiff.  Avoid activities that make the pain  worse. It is important to balance activity with rest.  Exercise your joint regularly with range-of-motion exercises as told by your health care provider. Try doing low-impact exercise, such as: ? Swimming. ? Water aerobics. ? Biking. ? Walking. Managing pain, stiffness, and swelling      If directed, put ice on the joint. ? Put ice in a plastic bag. ? Place a towel between your skin and the bag. ? Leave the ice on for 20 minutes, 2-3 times per day.  If your joint is swollen, raise (elevate) it above the level of your heart if directed by your health care provider.  If your joint feels stiff in the morning, try taking a warm shower.  If directed, apply heat to the affected area as often as told by your health care provider. Use the heat source that your health care provider recommends, such as a moist heat pack or a heating pad. If you have diabetes, do not apply heat without permission from your health care provider. To apply heat: ? Place a towel between your skin and the heat source. ? Leave the heat on for 20-30 minutes. ? Remove the heat if your skin turns bright red. This is especially important if you are unable to feel pain, heat, or cold. You may have a greater risk of getting burned. General instructions  Do not use any products that contain nicotine or tobacco, such as cigarettes, e-cigarettes, and chewing tobacco. If you need help quitting, ask your health care  provider.  Keep all follow-up visits as told by your health care provider. This is important. Contact a health care provider if:  The pain gets worse.  You have a fever. Get help right away if:  You develop severe joint pain, swelling, or redness.  Many joints become painful and swollen.  You develop severe back pain.  You develop severe weakness in your leg.  You cannot control your bladder or bowels. Summary  Arthritis is a term that is commonly used to refer to joint pain or joint disease. There  are more than 100 types of arthritis.  The most common cause of this condition is wear and tear of a joint. Other causes include gout, inflammation or infection of the joint, sprains, or allergies.  Symptoms of this condition include redness, swelling, or stiffness of the joint. Other symptoms include warmth, fever, or feeling ill.  This condition is treated with rest, elevation, medicines, and applying cold or hot packs.  Follow your health care provider's instructions about medicines, activity, exercises, and other home care treatments. This information is not intended to replace advice given to you by your health care provider. Make sure you discuss any questions you have with your health care provider. Document Released: 02/12/2004 Document Revised: 12/12/2017 Document Reviewed: 12/12/2017 Elsevier Patient Education  2020 [place].       Osteoarthritis  Osteoarthritis is a type of arthritis that affects tissue that covers the ends of bones in joints (cartilage). Cartilage acts as a cushion between the bones and helps them move smoothly. Osteoarthritis results when cartilage in the joints gets worn down. Osteoarthritis is sometimes called "wear and tear" arthritis. Osteoarthritis is the most common form of arthritis. It often occurs in older people. It is a condition that gets worse over time (a progressive condition). Joints that are most often affected by this condition are in:  Fingers.  Toes.  Hips.  Knees.  Spine, including neck and lower back. What are the causes? This condition is caused by age-related wearing down of cartilage that covers the ends of bones. What increases the risk? The following factors may make you more likely to develop this condition:  Older age.  Being overweight or obese.  Overuse of joints, such as in athletes.  Past injury of a joint.  Past surgery on a joint.  Family history of osteoarthritis. What are the signs or  symptoms? The main symptoms of this condition are pain, swelling, and stiffness in the joint. The joint may lose its shape over time. Small pieces of bone or cartilage may break off and float inside of the joint, which may cause more pain and damage to the joint. Small deposits of bone (osteophytes) may grow on the edges of the joint. Other symptoms may include:  A grating or scraping feeling inside the joint when you move it.  Popping or creaking sounds when you move. Symptoms may affect one or more joints. Osteoarthritis in a major joint, such as your knee or hip, can make it painful to walk or exercise. If you have osteoarthritis in your hands, you might not be able to grip items, twist your hand, or control small movements of your hands and fingers (fine motor skills). How is this diagnosed? This condition may be diagnosed based on:  Your medical history.  A physical exam.  Your symptoms.  X-rays of the affected joint(s).  Blood tests to rule out other types of arthritis. How is this treated? There is no cure for  this condition, but treatment can help to control pain and improve joint function. Treatment plans may include:  A prescribed exercise program that allows for rest and joint relief. You may work with a physical therapist.  A weight control plan.  Pain relief techniques, such as: ? Applying heat and cold to the joint. ? Electric pulses delivered to nerve endings under the skin (transcutaneous electrical nerve stimulation, or TENS). ? Massage. ? Certain nutritional supplements.  NSAIDs or prescription medicines to help relieve pain.  Medicine to help relieve pain and inflammation (corticosteroids). This can be given by mouth (orally) or as an injection.  Assistive devices, such as a brace, wrap, splint, specialized glove, or cane.  Surgery, such as: ? An osteotomy. This is done to reposition the bones and relieve pain or to remove loose pieces of bone and  cartilage. ? Joint replacement surgery. You may need this surgery if you have very bad (advanced) osteoarthritis. Follow these instructions at home: Activity  Rest your affected joints as directed by your health care provider.  Do not drive or use heavy machinery while taking prescription pain medicine.  Exercise as directed. Your health care provider or physical therapist may recommend specific types of exercise, such as: ? Strengthening exercises. These are done to strengthen the muscles that support joints that are affected by arthritis. They can be performed with weights or with exercise bands to add resistance. ? Aerobic activities. These are exercises, such as brisk walking or water aerobics, that get your heart pumping. ? Range-of-motion activities. These keep your joints easy to move. ? Balance and agility exercises. Managing pain, stiffness, and swelling      If directed, apply heat to the affected area as often as told by your health care provider. Use the heat source that your health care provider recommends, such as a moist heat pack or a heating pad. ? If you have a removable assistive device, remove it as told by your health care provider. ? Place a towel between your skin and the heat source. If your health care provider tells you to keep the assistive device on while you apply heat, place a towel between the assistive device and the heat source. ? Leave the heat on for 20-30 minutes. ? Remove the heat if your skin turns bright red. This is especially important if you are unable to feel pain, heat, or cold. You may have a greater risk of getting burned.  If directed, put ice on the affected joint: ? If you have a removable assistive device, remove it as told by your health care provider. ? Put ice in a plastic bag. ? Place a towel between your skin and the bag. If your health care provider tells you to keep the assistive device on during icing, place a towel between the  assistive device and the bag. ? Leave the ice on for 20 minutes, 2-3 times a day. General instructions  Take over-the-counter and prescription medicines only as told by your health care provider.  Maintain a healthy weight. Follow instructions from your health care provider for weight control. These may include dietary restrictions.  Do not use any products that contain nicotine or tobacco, such as cigarettes and e-cigarettes. These can delay bone healing. If you need help quitting, ask your health care provider.  Use assistive devices as directed by your health care provider.  Keep all follow-up visits as told by your health care provider. This is important. Where to find more information  [employer] of Arthritis and Musculoskeletal and Skin Diseases: www.niams.SouthExposed.es  [employer] on Aging: http://kim-miller.com/  American College of Rheumatology: www.rheumatology.org Contact a health care provider if:  Your skin turns red.  You develop a rash.  You have pain that gets worse.  You have a fever along with joint or muscle aches. Get help right away if:  You lose a lot of weight.  You suddenly lose your appetite.  You have night sweats. Summary  Osteoarthritis is a type of arthritis that affects tissue covering the ends of bones in joints (cartilage).  This condition is caused by age-related wearing down of cartilage that covers the ends of bones.  The main symptom of this condition is pain, swelling, and stiffness in the joint.  There is no cure for this condition, but treatment can help to control pain and improve joint function. This information is not intended to replace advice given to you by your health care provider. Make sure you discuss any questions you have with your health care provider. Document Released: 01/04/2005 Document Revised: 12/17/2016 Document Reviewed: 09/08/2015 Elsevier Patient Education  2020 [employer].

## 2018-08-16 ENCOUNTER — Other Ambulatory Visit: Payer: Self-pay

## 2018-08-16 DIAGNOSIS — R918 Other nonspecific abnormal finding of lung field: Secondary | ICD-10-CM

## 2018-08-28 ENCOUNTER — Encounter: Payer: Self-pay | Admitting: Family Medicine

## 2018-09-03 DIAGNOSIS — M1990 Unspecified osteoarthritis, unspecified site: Secondary | ICD-10-CM | POA: Insufficient documentation

## 2018-09-27 ENCOUNTER — Encounter: Payer: Self-pay | Admitting: Family Medicine

## 2018-10-24 ENCOUNTER — Encounter: Payer: Self-pay | Admitting: Gastroenterology

## 2018-11-13 ENCOUNTER — Ambulatory Visit (AMBULATORY_SURGERY_CENTER): Payer: Self-pay | Admitting: *Deleted

## 2018-11-13 ENCOUNTER — Other Ambulatory Visit: Payer: Self-pay

## 2018-11-13 VITALS — Temp 97.3°F | Ht 70.0 in | Wt 135.0 lb

## 2018-11-13 DIAGNOSIS — Z1159 Encounter for screening for other viral diseases: Secondary | ICD-10-CM

## 2018-11-13 DIAGNOSIS — Z86010 Personal history of colonic polyps: Secondary | ICD-10-CM

## 2018-11-13 MED ORDER — SUPREP BOWEL PREP KIT 17.5-3.13-1.6 GM/177ML PO SOLN: 1.0000 | Freq: Once | ORAL | 0 refills | Status: AC

## 2018-11-13 NOTE — Progress Notes (Signed)
Patient denies any allergies to egg or soy products. Patient denies complications with anesthesia/sedation.  Patient denies oxygen use at home and denies diet medications. Emmi instructions for colonoscopy/endoscopy explained and given to patient.   

## 2018-11-14 ENCOUNTER — Encounter: Payer: Self-pay | Admitting: Gastroenterology

## 2018-11-22 ENCOUNTER — Other Ambulatory Visit: Payer: Self-pay | Admitting: Gastroenterology

## 2018-11-23 LAB — SARS CORONAVIRUS 2 (TAT 6-24 HRS): SARS Coronavirus 2: NEGATIVE

## 2018-11-27 ENCOUNTER — Encounter: Payer: Self-pay | Admitting: Gastroenterology

## 2018-11-27 ENCOUNTER — Ambulatory Visit (AMBULATORY_SURGERY_CENTER): Payer: Commercial Managed Care - PPO | Admitting: Gastroenterology

## 2018-11-27 ENCOUNTER — Other Ambulatory Visit: Payer: Self-pay

## 2018-11-27 VITALS — BP 117/72 | HR 79 | Temp 98.3°F | Resp 16 | Ht 70.0 in | Wt 135.0 lb

## 2018-11-27 DIAGNOSIS — Z86010 Personal history of colonic polyps: Secondary | ICD-10-CM | POA: Diagnosis present

## 2018-11-27 MED ORDER — SODIUM CHLORIDE 0.9 % IV SOLN: 500.0000 mL | Freq: Once | INTRAVENOUS | Status: DC

## 2018-11-27 NOTE — Progress Notes (Signed)
Temperature taken by J.B., VS taken by C.W. 

## 2018-11-27 NOTE — Progress Notes (Signed)
Pt's states no medical or surgical changes since previsit or office visit. 

## 2018-11-27 NOTE — Patient Instructions (Signed)
HANDOUTS GIVEN: HEMORRHOIDS AND DIVERTICULOSIS   YOU HAD AN ENDOSCOPIC PROCEDURE TODAY AT [facility]:   Refer to the procedure report that was given to you for any specific questions about what was found during the examination.  If the procedure report does not answer your questions, please call your gastroenterologist to clarify.  If you requested that your care partner not be given the details of your procedure findings, then the procedure report has been included in a sealed envelope for you to review at your convenience later.  YOU SHOULD EXPECT: Some feelings of bloating in the abdomen. Passage of more gas than usual.  Walking can help get rid of the air that was put into your GI tract during the procedure and reduce the bloating. If you had a lower endoscopy (such as a colonoscopy or flexible sigmoidoscopy) you may notice spotting of blood in your stool or on the toilet paper. If you underwent a bowel prep for your procedure, you may not have a normal bowel movement for a few days.  Please Note:  You might notice some irritation and congestion in your nose or some drainage.  This is from the oxygen used during your procedure.  There is no need for concern and it should clear up in a day or so.  SYMPTOMS TO REPORT IMMEDIATELY:   Following lower endoscopy (colonoscopy or flexible sigmoidoscopy):  Excessive amounts of blood in the stool  Significant tenderness or worsening of abdominal pains  Swelling of the abdomen that is new, acute  Fever of 100F or higher   For urgent or emergent issues, a gastroenterologist can be reached at any hour by calling [phone].   DIET:  We do recommend a small meal at first, but then you may proceed to your regular diet.  Drink plenty of fluids but you should avoid alcoholic beverages for 24 hours.  ACTIVITY:  You should plan to take it easy for the rest of today and you should NOT DRIVE or use heavy machinery until tomorrow (because  of the sedation medicines used during the test).    FOLLOW UP: Our staff will call the number listed on your records 48-72 hours following your procedure to check on you and address any questions or concerns that you may have regarding the information given to you following your procedure. If we do not reach you, we will leave a message.  We will attempt to reach you two times.  During this call, we will ask if you have developed any symptoms of COVID 19. If you develop any symptoms (ie: fever, flu-like symptoms, shortness of breath, cough etc.) before then, please call [phone].  If you test positive for Covid 19 in the 2 weeks post procedure, please call and report this information to [place].    If any biopsies were taken you will be contacted by phone or by letter within the next 1-3 weeks.  Please call us at [phone] if you have not heard about the biopsies in 3 weeks.    SIGNATURES/CONFIDENTIALITY: You and/or your care partner have signed paperwork which will be entered into your electronic medical record.  These signatures attest to the fact that that the information above on your After Visit Summary has been reviewed and is understood.  Full responsibility of the confidentiality of this discharge information lies with you and/or your care-partner.

## 2018-11-27 NOTE — Progress Notes (Signed)
To PACU, VSS. Report to [email or web address] 

## 2018-11-27 NOTE — Op Note (Signed)
[facility] Patient Name: Zachary Higgins Procedure Date: 11/27/2018 10:38 AM MRN: [id number] Endoscopist: Jackquline Denmark , MD Age: 58 Referring MD:  Date of Birth: 1960-07-16 Gender: Male Account #: [id number] Procedure:                Colonoscopy Indications:              High risk colon cancer surveillance: Personal                            history of colonic polyps Medicines:                Monitored Anesthesia Care Procedure:                Pre-Anesthesia Assessment:                           - Prior to the procedure, a History and Physical                            was performed, and patient medications and                            allergies were reviewed. The patient's tolerance of                            previous anesthesia was also reviewed. The risks                            and benefits of the procedure and the sedation                            options and risks were discussed with the patient.                            All questions were answered, and informed consent                            was obtained. Prior Anticoagulants: The patient has                            taken no previous anticoagulant or antiplatelet                            agents. ASA Grade Assessment: II - A patient with                            mild systemic disease. After reviewing the risks                            and benefits, the patient was deemed in                            satisfactory condition to undergo the procedure.  After obtaining informed consent, the colonoscope                            was passed under direct vision. Throughout the                            procedure, the patient's blood pressure, pulse, and                            oxygen saturations were monitored continuously. The                            Colonoscope was introduced through the anus and                            advanced to the 2 cm into the ileum. The                          colonoscopy was performed without difficulty. The                            patient tolerated the procedure well. The quality                            of the bowel preparation was good. The terminal                            ileum, ileocecal valve, appendiceal orifice, and                            rectum were photographed. Scope In: 10:40:46 AM Scope Out: 10:53:57 AM Scope Withdrawal Time: 0 hours 7 minutes 32 seconds  Total Procedure Duration: 0 hours 13 minutes 11 seconds  Findings:                 A few small-mouthed diverticula were found in the                            sigmoid colon, descending colon, transverse colon                            and ascending colon.                           Non-bleeding internal hemorrhoids were found during                            retroflexion. The hemorrhoids were small.                           The terminal ileum appeared normal.                           The exam was otherwise without abnormality on  direct and retroflexion views. Complications:            No immediate complications. Estimated Blood Loss:     Estimated blood loss: none. Impression:               -Mild pancolonic diverticulosis.                           -Non-bleeding internal hemorrhoids.                           -Otherwise normal colonoscopy to TI. Recommendation:           - Patient has a contact number available for                            emergencies. The signs and symptoms of potential                            delayed complications were discussed with the                            patient. Return to normal activities tomorrow.                            Written discharge instructions were provided to the                            patient.                           - Resume previous high-fiber diet.                           - Continue present medications.                           - Repeat colonoscopy in  10 years for screening                            purposes. Earlier, if with any new problems or if                            there is any change in family history.                           - Return to GI clinic PRN.                           - If he has any problems with hemorrhoids in                            future, he would use Preparation H and get in touch                            with [place].                           -  D/W Charlynn Court, MD 11/27/2018 10:58:39 AM This report has been signed electronically.

## 2018-11-29 ENCOUNTER — Telehealth: Payer: Self-pay

## 2018-11-29 NOTE — Telephone Encounter (Signed)
Called [phone] and left a messaged we tried to reach pt for a follow up call. maw

## 2018-11-29 NOTE — Telephone Encounter (Signed)
1st follow up call attempted. NALM

## 2019-01-05 ENCOUNTER — Other Ambulatory Visit: Payer: Self-pay | Admitting: Family Medicine

## 2019-01-05 DIAGNOSIS — J3089 Other allergic rhinitis: Secondary | ICD-10-CM

## 2019-01-05 DIAGNOSIS — J324 Chronic pansinusitis: Secondary | ICD-10-CM

## 2019-05-21 ENCOUNTER — Other Ambulatory Visit: Payer: Self-pay | Admitting: Family Medicine

## 2019-05-21 DIAGNOSIS — J324 Chronic pansinusitis: Secondary | ICD-10-CM

## 2019-05-21 DIAGNOSIS — J3089 Other allergic rhinitis: Secondary | ICD-10-CM

## 2019-08-28 ENCOUNTER — Telehealth (INDEPENDENT_AMBULATORY_CARE_PROVIDER_SITE_OTHER): Payer: Commercial Managed Care - PPO | Admitting: Medical-Surgical

## 2019-08-28 ENCOUNTER — Other Ambulatory Visit: Payer: Self-pay

## 2019-08-28 ENCOUNTER — Encounter: Payer: Self-pay | Admitting: Medical-Surgical

## 2019-08-28 DIAGNOSIS — J019 Acute sinusitis, unspecified: Secondary | ICD-10-CM | POA: Diagnosis not present

## 2019-08-28 MED ORDER — DOXYCYCLINE HYCLATE 100 MG PO TABS: 100.0000 mg | ORAL_TABLET | Freq: Two times a day (BID) | ORAL | 0 refills | Status: AC

## 2019-08-28 NOTE — Progress Notes (Signed)
Virtual Visit via Telephone   I connected with  Zachary Higgins  on 08/28/19 by telephone/telehealth and verified that I am speaking with the correct person using two identifiers.   I discussed the limitations, risks, security and privacy concerns of performing an evaluation and management service by telephone, including the higher likelihood of inaccurate diagnosis and treatment, and the availability of in person appointments.  We also discussed the likely need of an additional face to face encounter for complete and high quality delivery of care.  I also discussed with the patient that there may be a patient responsible charge related to this service. The patient expressed understanding and wishes to proceed.  Provider location is in medical facility. Patient location is at their home, different from provider location. People involved in care of the patient during this telehealth encounter were myself, my nurse/medical assistant, and my front office/scheduling team member.  CC: sinus symptoms  HPI: Pleasant 59 year old male presenting via telephone visit with 5 days of upper respiratory symptoms including severe headache, nasal congestion, intermittent rhinorrhea, postnasal drip, mild sore throat, intermittent chills, bilateral ear pressure/popping, and a mild cough.  Nasal drainage noted to be bloody in the mornings for the last couple of days.  Did have 1 episode of GI upset a couple of days ago but this is resolved.  Denies fever, nausea, abdominal pain, shortness of breath, and chest pain.  Has tried taking Mucinex but this seems to cause palpitations and heart irregularities.  Has not had his Covid vaccine but is planning to get it soon.  Review of Systems: See HPI for pertinent positives and negatives.   Objective Findings:    General: Speaking full sentences, no audible heavy breathing.  Sounds alert and appropriately interactive.    Independent interpretation of tests performed by  another provider:   None.  Impression and Recommendations:    1. Acute non-recurrent sinusitis, unspecified location Doxycycline BID x 7 days. Continue symptomatic treatment at home. Increase PO fluids. Recommend COVID testing.   Return if symptoms worsen or fail to improve.  20 minutes of non-face-to-face time was provided during this encounter.  I discussed the above assessment and treatment plan with the patient. The patient was provided an opportunity to ask questions and all were answered. The patient agreed with the plan and demonstrated an understanding of the instructions.   The patient was advised to call back or seek an in-person evaluation if the symptoms worsen or if the condition fails to improve as anticipated.   Clearnce Sorrel, DNP, APRN, FNP-BC [facility]

## 2019-10-24 ENCOUNTER — Other Ambulatory Visit: Payer: Self-pay | Admitting: Family Medicine

## 2019-10-24 ENCOUNTER — Encounter: Payer: Self-pay | Admitting: Physician Assistant

## 2019-10-24 ENCOUNTER — Other Ambulatory Visit: Payer: Self-pay

## 2019-10-24 ENCOUNTER — Ambulatory Visit (INDEPENDENT_AMBULATORY_CARE_PROVIDER_SITE_OTHER): Payer: Commercial Managed Care - PPO | Admitting: Physician Assistant

## 2019-10-24 VITALS — BP 115/70 | HR 64 | Ht 70.0 in | Wt 143.8 lb

## 2019-10-24 DIAGNOSIS — Z122 Encounter for screening for malignant neoplasm of respiratory organs: Secondary | ICD-10-CM | POA: Diagnosis not present

## 2019-10-24 DIAGNOSIS — J3089 Other allergic rhinitis: Secondary | ICD-10-CM

## 2019-10-24 DIAGNOSIS — E559 Vitamin D deficiency, unspecified: Secondary | ICD-10-CM | POA: Diagnosis not present

## 2019-10-24 DIAGNOSIS — Z0000 Encounter for general adult medical examination without abnormal findings: Secondary | ICD-10-CM | POA: Diagnosis not present

## 2019-10-24 DIAGNOSIS — Z125 Encounter for screening for malignant neoplasm of prostate: Secondary | ICD-10-CM | POA: Diagnosis not present

## 2019-10-24 DIAGNOSIS — Z8709 Personal history of other diseases of the respiratory system: Secondary | ICD-10-CM

## 2019-10-24 DIAGNOSIS — G8929 Other chronic pain: Secondary | ICD-10-CM

## 2019-10-24 DIAGNOSIS — R519 Headache, unspecified: Secondary | ICD-10-CM

## 2019-10-24 MED ORDER — LORATADINE 10 MG PO TABS: 10.0000 mg | ORAL_TABLET | Freq: Every day | ORAL | 2 refills | Status: DC

## 2019-10-24 MED ORDER — ALBUTEROL SULFATE HFA 108 (90 BASE) MCG/ACT IN AERS: 2.0000 | INHALATION_SPRAY | Freq: Four times a day (QID) | RESPIRATORY_TRACT | 0 refills | Status: AC | PRN

## 2019-10-24 NOTE — Progress Notes (Signed)
Male physical   Impression and Recommendations:    1. Vitamin D insufficiency   2. Healthcare maintenance   3. Screening for prostate cancer   4. Screening for lung cancer   5. Chronic nonintractable headache, unspecified headache type   6. Environmental and seasonal allergies   7. History of asthma      1) Anticipatory Guidance: Discussed skin CA prevention and sunscreen when outside along with skin surveillance; eating a balanced and modest diet; physical activity at least 25 minutes per day or minimum of 150 min/ week moderate to intense activity.  2) Immunizations / Screenings / Labs:   All immunizations are up-to-date per recommendations or will be updated today if pt allows.    - Patient understands with dental and vision screens they will schedule independently.  - Will obtain CBC, CMP, HgA1c, Lipid panel, TSH, PSA and vit D when fasting. - UTD on colonoscopy, Tdap, Hep and HIV screenings   3) Weight: Discussed goal to improve diet habits to improve overall feelings of well being and objective health data. Improve nutrient density of diet through increasing intake of fruits and vegetables and decreasing saturated fats, white flour products and refined sugars.  - BMI wnl  4) Healthcare Maintenance: -Patient requesting refill of allergy medication (loratadine). Reports a history of childhood asthma and recently had a few episodes of wheezing related to his seasonal allergies. He doesn't like taking a lot of medication if not needed. Will send rx for albuterol. -Pending Vitamin D results will send additional refills. -Will place order for head CT for further evaluation of chronic headaches. Recommend to reduce caffeine. -Will place order for chest CT for lung cancer screening. Patient was suppose to have a 6 month follow up for repeat imaging and did not. -Follow up in 1 year for CPE and FBW. -Schedule nurse visit if decides to get Shingrix vaccine. -Pending imaging  studies will determine sooner f/up if needed.  Orders Placed This Encounter  Procedures  . CT CHEST LUNG CA SCREEN LOW DOSE W/O CM    Standing Status:   Future    Standing Expiration Date:   10/23/2020    Order Specific Question:   Reason for Exam (SYMPTOM  OR DIAGNOSIS REQUIRED)    Answer:   screening for lung cancer    Order Specific Question:   Preferred Imaging Location?    Answer:   [facility]  . CT Head Wo Contrast    Standing Status:   Future    Standing Expiration Date:   10/23/2020    Order Specific Question:   Preferred imaging location?    Answer:   [facility]  . CBC  . Comprehensive metabolic panel    Order Specific Question:   Has the patient fasted?    Answer:   Yes  . Hemoglobin A1c  . Lipid panel    Order Specific Question:   Has the patient fasted?    Answer:   Yes  . TSH  . PSA  . VITAMIN D 25 Hydroxy (Vit-D Deficiency, Fractures)    Meds ordered this encounter  Medications  . loratadine (CLARITIN) 10 MG tablet    Sig: Take 1 tablet (10 mg total) by mouth daily.    Dispense:  90 tablet    Refill:  2    Order Specific Question:   Supervising Provider    Answer:   Beatrice Lecher D [[id number]]  . albuterol (VENTOLIN HFA) 108 (90 Base) MCG/ACT  inhaler    Sig: Inhale 2 puffs into the lungs every 6 (six) hours as needed for wheezing.    Dispense:  8 g    Refill:  0    Order Specific Question:   Supervising Provider    Answer:   Beatrice Lecher D [[id number]]     Return in about 1 year (around 10/23/2020) for CPE and FBW.     Gross side effects, risk and benefits, and alternatives of medications discussed with patient.  Patient is aware that all medications have potential side effects and we are unable to predict every side effect or drug-drug interaction that may occur.  Expresses verbal understanding and consents to current therapy plan and treatment regimen.  Please see AVS handed out to patient at the end of our visit for further patient  instructions/ counseling done pertaining to today's office visit.       Subjective:        CC: CPE   HPI: Zachary Higgins is a 59 y.o. male who presents to [facility] today for a yearly health maintenance exam.     Health Maintenance Summary  - Reviewed and updated, unless pt declines services.  Last Cologuard or Colonoscopy:  11/27/18- repeat in 10 years Tobacco History Reviewed:  Yes, former smoker Abdominal Ultrasound: N/A CT scan for screening lung CA:  Placed order. Alcohol / drug use:  No concerns, no excessive use / no use Dental Home: Y  Eye exams: Y   Male history: STD concerns:   none  Additional concerns beyond Health Maintenance issues:   Chronic headaches. Continues to have daily headaches which are usually dull and have not resolved for more than a year. Denies vision changes or aura, no history of migraines. He drinks lots of coffee throughout the day.    Immunization History  Administered Date(s) Administered  . PFIZER SARS-COV-2 Vaccination 09/07/2019  . Tdap 07/25/2018     Health Maintenance  Topic Date Due  . INFLUENZA VACCINE  Never done  . COVID-19 Vaccine (2 - Pfizer 2-dose series) 09/28/2019  . TETANUS/TDAP  07/24/2028  . COLONOSCOPY  11/26/2028  . Hepatitis C Screening  Completed  . HIV Screening  Completed       Wt Readings from Last 3 Encounters:  10/24/19 143 lb 12.8 oz (65.2 kg)  11/27/18 135 lb (61.2 kg)  11/13/18 135 lb (61.2 kg)   BP Readings from Last 3 Encounters:  10/24/19 115/70  11/27/18 117/72  08/15/18 131/80   Pulse Readings from Last 3 Encounters:  10/24/19 64  11/27/18 79  07/25/18 61    Patient Active Problem List   Diagnosis Date Noted  . Osteoarthritis 09/03/2018  . Stopped smoking with greater than 30 pack year history 08/15/2018  . Pulmonary nodule, left- upper post lobe- 6.5 mm in 07/2018 08/15/2018  . Emphysema of lung ([place])- ct scan findings 08/15/2018  . Vitamin D  insufficiency 08/15/2018  . Actinic keratosis due to exposure to sunlight 07/25/2018  . Atypical nevi 07/25/2018  . Nonintractable episodic headache 02/27/2018  . Caffeine abuse, continuous ([place]) 02/27/2018  . Chronic pansinusitis 02/27/2018  . Environmental and seasonal allergies 02/27/2018  . At high risk for dehydration 02/27/2018  . Elevated blood pressure reading 02/27/2018    Past Medical History:  Diagnosis Date  . Allergy   . Arthritis    knees, ankles - no meds  . Asthma    as a child, no problems as an adult,  no inhalers    Past Surgical History:  Procedure Laterality Date  . COLONOSCOPY  10/19/2013   polyp  . WISDOM TOOTH EXTRACTION     local    Family History  Problem Relation Age of Onset  . Diabetes Maternal Uncle   . Diabetes Paternal Uncle   . Colon cancer Neg Hx   . Rectal cancer Neg Hx   . Stomach cancer Neg Hx     Social History   Substance and Sexual Activity  Drug Use Never  ,  Social History   Substance and Sexual Activity  Alcohol Use Yes  . Alcohol/week: 2.0 - 3.0 standard drinks  . Types: 2 - 3 Cans of beer per week  ,  Social History   Tobacco Use  Smoking Status Former Smoker  . Packs/day: 1.00  . Types: Cigarettes  . Quit date: 2015  . Years since quitting: 6.7  Smokeless Tobacco Former Systems developer  . Quit date: 2015  ,  Social History   Substance and Sexual Activity  Sexual Activity Not Currently    Patient's Medications  New Prescriptions   ALBUTEROL (VENTOLIN HFA) 108 (90 BASE) MCG/ACT INHALER    Inhale 2 puffs into the lungs every 6 (six) hours as needed for wheezing.   LORATADINE (CLARITIN) 10 MG TABLET    Take 1 tablet (10 mg total) by mouth daily.  Previous Medications   No medications on file  Modified Medications   No medications on file  Discontinued Medications   No medications on file    Penicillins  Review of Systems: General:   Denies fever, chills, unexplained weight loss.  Optho/Auditory:   Denies  visual changes, blurred vision/LOV Respiratory:   Denies SOB, DOE more than baseline levels.   Cardiovascular:   Denies chest pain, palpitations, new onset peripheral edema  Gastrointestinal:   Denies nausea, vomiting, diarrhea.  Genitourinary: Denies dysuria, freq/ urgency, flank pain or discharge from genitals.  Endocrine:     Denies hot or cold intolerance, polyuria, polydipsia. Musculoskeletal:   Denies  joint swelling, unexplained arthralgias, gait problems.  Skin:  Denies rash, suspicious lesions Neurological:     Denies dizziness, unexplained weakness, numbness  Psychiatric/Behavioral:   Denies mood changes, suicidal or homicidal ideations, hallucinations    Objective:     Blood pressure 115/70, pulse 64, height 5\' 10"  (1.778 m), weight 143 lb 12.8 oz (65.2 kg), SpO2 100 %. Body mass index is 20.63 kg/m. General Appearance:    Alert, cooperative, no distress, appears stated age  Head:    Normocephalic, without obvious abnormality, atraumatic  Eyes:    PERRL, conjunctiva/corneas clear, EOM's intact, both eyes  Ears:    Normal TM's and external ear canals, both ears  Nose:   Nares normal, septum midline, mucosa normal, no drainage    or sinus tenderness  Throat:   Lips w/o lesion, mucosa moist, and tongue normal; teeth and gums normal  Neck:   Supple, symmetrical, trachea midline, no adenopathy;    thyroid:  no enlargement/tenderness/nodules;   Back:     Symmetric, no curvature, ROM normal, no CVA tenderness  Lungs:     Clear to auscultation bilaterally, respirations unlabored, no  Wh/ R/ R  Chest Wall:    No tenderness or gross deformity; normal excursion   Heart:    Regular rate and rhythm, S1 and S2 normal, no murmur, rub   or gallop  Abdomen:     Soft, non-tender, bowel sounds active all four quadrants, No  G/R/R, no masses, no organomegaly  Genitalia:   Declined. No concerns/sxs.  Rectal:   Declined. No concerns/sxs. Checking PSA.  Extremities:   Extremities normal,  atraumatic, no cyanosis or gross edema  Pulses:   Normal  Skin:   Warm, dry, Skin color, texture, turgor normal, no obvious rashes or lesions  M-Sk:   Ambulates * 4 w/o difficulty, no gross deformities, tone WNL  Neurologic:   CNII-XII grossly intact, normal strength, sensation and reflexes    Throughout Psych:  No HI/SI, judgement and insight good, Euthymic mood. Full Affect.     Marland Kitchen

## 2019-10-24 NOTE — Patient Instructions (Signed)
Preventive Care 57-57 Years Old, Male Preventive care refers to lifestyle choices and visits with your health care provider that can promote health and wellness. This includes:  A yearly physical exam. This is also called an annual well check.  Regular dental and eye exams.  Immunizations.  Screening for certain conditions.  Healthy lifestyle choices, such as eating a healthy diet, getting regular exercise, not using drugs or products that contain nicotine and tobacco, and limiting alcohol use. What can I expect for my preventive care visit? Physical exam Your health care provider will check:  Height and weight. These may be used to calculate body mass index (BMI), which is a measurement that tells if you are at a healthy weight.  Heart rate and blood pressure.  Your skin for abnormal spots. Counseling Your health care provider may ask you questions about:  Alcohol, tobacco, and drug use.  Emotional well-being.  Home and relationship well-being.  Sexual activity.  Eating habits.  Work and work Statistician. What immunizations do I need?  Influenza (flu) vaccine  This is recommended every year. Tetanus, diphtheria, and pertussis (Tdap) vaccine  You may need a Td booster every 10 years. Varicella (chickenpox) vaccine  You may need this vaccine if you have not already been vaccinated. Zoster (shingles) vaccine  You may need this after age 57. Measles, mumps, and rubella (MMR) vaccine  You may need at least one dose of MMR if you were born in 1955 or later. You may also need a second dose. Pneumococcal conjugate (PCV13) vaccine  You may need this if you have certain conditions and were not previously vaccinated. Pneumococcal polysaccharide (PPSV23) vaccine  You may need one or two doses if you smoke cigarettes or if you have certain conditions. Meningococcal conjugate (MenACWY) vaccine  You may need this if you have certain conditions. Hepatitis A  vaccine  You may need this if you have certain conditions or if you travel or work in places where you may be exposed to hepatitis A. Hepatitis B vaccine  You may need this if you have certain conditions or if you travel or work in places where you may be exposed to hepatitis B. Haemophilus influenzae type b (Hib) vaccine  You may need this if you have certain risk factors. Human papillomavirus (HPV) vaccine  If recommended by your health care provider, you may need three doses over 6 months. You may receive vaccines as individual doses or as more than one vaccine together in one shot (combination vaccines). Talk with your health care provider about the risks and benefits of combination vaccines. What tests do I need? Blood tests  Lipid and cholesterol levels. These may be checked every 5 years, or more frequently if you are over 57 years old.  Hepatitis C test.  Hepatitis B test. Screening  Lung cancer screening. You may have this screening every year starting at age 57 if you have a 30-pack-year history of smoking and currently smoke or have quit within the past 15 years.  Prostate cancer screening. Recommendations will vary depending on your family history and other risks.  Colorectal cancer screening. All adults should have this screening starting at age 57 and continuing until age 2. Your health care provider may recommend screening at age 57 if you are at increased risk. You will have tests every 1-10 years, depending on your results and the type of screening test.  Diabetes screening. This is done by checking your blood sugar (glucose) after you have not eaten  for a while (fasting). You may have this done every 1-3 years.  Sexually transmitted disease (STD) testing. Follow these instructions at home: Eating and drinking  Eat a diet that includes fresh fruits and vegetables, whole grains, lean protein, and low-fat dairy products.  Take vitamin and mineral supplements as  recommended by your health care provider.  Do not drink alcohol if your health care provider tells you not to drink.  If you drink alcohol: ? Limit how much you have to 0-2 drinks a day. ? Be aware of how much alcohol is in your drink. In the [place] one drink equals one 12 oz bottle of beer (355 mL), one 5 oz glass of wine (148 mL), or one 1 oz glass of hard liquor (44 mL). Lifestyle  Take daily care of your teeth and gums.  Stay active. Exercise for at least 30 minutes on 5 or more days each week.  Do not use any products that contain nicotine or tobacco, such as cigarettes, e-cigarettes, and chewing tobacco. If you need help quitting, ask your health care provider.  If you are sexually active, practice safe sex. Use a condom or other form of protection to prevent STIs (sexually transmitted infections).  Talk with your health care provider about taking a low-dose aspirin every day starting at age 57. What's next?  Go to your health care provider once a year for a well check visit.  Ask your health care provider how often you should have your eyes and teeth checked.  Stay up to date on all vaccines. This information is not intended to replace advice given to you by your health care provider. Make sure you discuss any questions you have with your health care provider. Document Revised: 12/29/2017 Document Reviewed: 12/29/2017 Elsevier Patient Education  2020 [employer].

## 2019-10-25 LAB — COMPREHENSIVE METABOLIC PANEL
ALT: 28 IU/L (ref 0–44)
AST: 24 IU/L (ref 0–40)
Albumin/Globulin Ratio: 1.6 (ref 1.2–2.2)
Albumin: 4.4 g/dL (ref 3.8–4.9)
Alkaline Phosphatase: 101 IU/L (ref 44–121)
BUN/Creatinine Ratio: 12 (ref 9–20)
BUN: 11 mg/dL (ref 6–24)
Bilirubin Total: 0.4 mg/dL (ref 0.0–1.2)
CO2: 26 mmol/L (ref 20–29)
Calcium: 9.6 mg/dL (ref 8.7–10.2)
Chloride: 100 mmol/L (ref 96–106)
Creatinine, Ser: 0.89 mg/dL (ref 0.76–1.27)
GFR calc Af Amer: 110 mL/min/{1.73_m2} (ref >59)
GFR calc non Af Amer: 95 mL/min/{1.73_m2} (ref >59)
Globulin, Total: 2.7 g/dL (ref 1.5–4.5)
Glucose: 83 mg/dL (ref 65–99)
Potassium: 4.5 mmol/L (ref 3.5–5.2)
Sodium: 137 mmol/L (ref 134–144)
Total Protein: 7.1 g/dL (ref 6.0–8.5)

## 2019-10-25 LAB — PSA: Prostate Specific Ag, Serum: 0.4 ng/mL (ref 0.0–4.0)

## 2019-10-25 LAB — TSH: TSH: 1.4 u[IU]/mL (ref 0.450–4.500)

## 2019-10-25 LAB — LIPID PANEL
Chol/HDL Ratio: 3.3 ratio (ref 0.0–5.0)
Cholesterol, Total: 156 mg/dL (ref 100–199)
HDL: 48 mg/dL (ref >39)
LDL Chol Calc (NIH): 98 mg/dL (ref 0–99)
Triglycerides: 45 mg/dL (ref 0–149)
VLDL Cholesterol Cal: 10 mg/dL (ref 5–40)

## 2019-10-25 LAB — CBC
Hematocrit: 42.6 % (ref 37.5–51.0)
Hemoglobin: 14.5 g/dL (ref 13.0–17.7)
MCH: 30.7 pg (ref 26.6–33.0)
MCHC: 34 g/dL (ref 31.5–35.7)
MCV: 90 fL (ref 79–97)
Platelets: 286 10*3/uL (ref 150–450)
RBC: 4.73 x10E6/uL (ref 4.14–5.80)
RDW: 11.7 % (ref 11.6–15.4)
WBC: 8.3 10*3/uL (ref 3.4–10.8)

## 2019-10-25 LAB — VITAMIN D 25 HYDROXY (VIT D DEFICIENCY, FRACTURES): Vit D, 25-Hydroxy: 50.1 ng/mL (ref 30.0–100.0)

## 2019-10-25 LAB — HEMOGLOBIN A1C
Est. average glucose Bld gHb Est-mCnc: 103 mg/dL
Hgb A1c MFr Bld: 5.2 % (ref 4.8–5.6)

## 2019-11-06 ENCOUNTER — Other Ambulatory Visit: Payer: Self-pay | Admitting: Physician Assistant

## 2019-11-09 ENCOUNTER — Ambulatory Visit
Admission: RE | Admit: 2019-11-09 | Discharge: 2019-11-09 | Disposition: A | Discharge status: Home / Self Care | Payer: Commercial Managed Care - PPO | Source: Ambulatory Visit | Attending: Physician Assistant | Admitting: Physician Assistant

## 2019-11-09 ENCOUNTER — Other Ambulatory Visit: Payer: Self-pay

## 2019-11-09 DIAGNOSIS — Z122 Encounter for screening for malignant neoplasm of respiratory organs: Secondary | ICD-10-CM

## 2019-11-09 DIAGNOSIS — G8929 Other chronic pain: Secondary | ICD-10-CM

## 2020-07-26 IMAGING — CT CT CHEST LUNG CANCER SCREENING LOW DOSE
1 series · 15 of 34 positions shown, 19 images · non-contrast
Comparison: None.

CLINICAL DATA: 58-year-old male with 36 pack-year history of
smoking. Lung cancer screening.

EXAM:
CT CHEST WITHOUT CONTRAST LOW-DOSE FOR LUNG CANCER SCREENING
TECHNIQUE: Multidetector CT imaging of the chest was performed following the
standard protocol without IV contrast.

[Series 2: ldct screening <30 bmi · axial · 0.77mm/px · z∈[-330,-25]mm · 15 of 73 slices shown, 19 images]
[im 6/73  mediastinal]
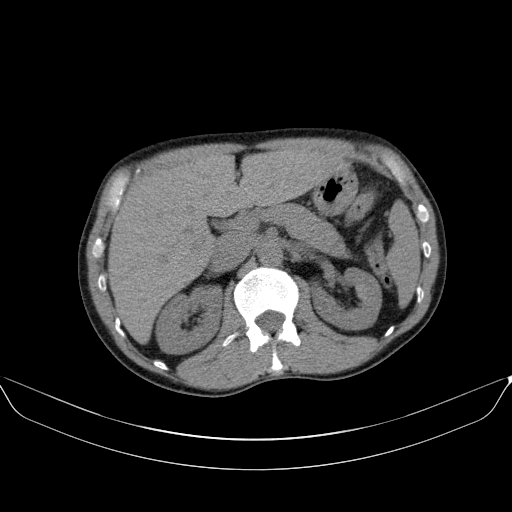
[im 6/73  lung]
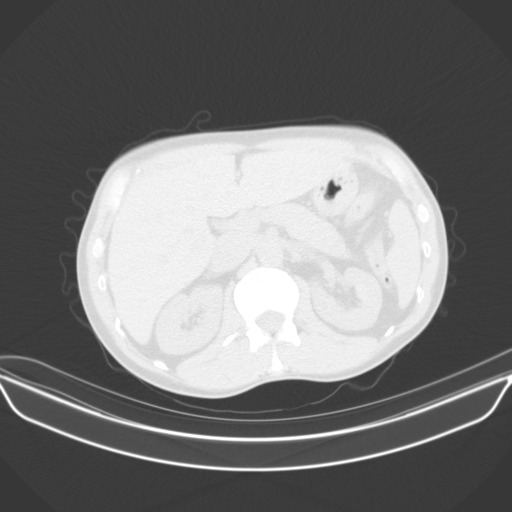
[im 11/73  lung]
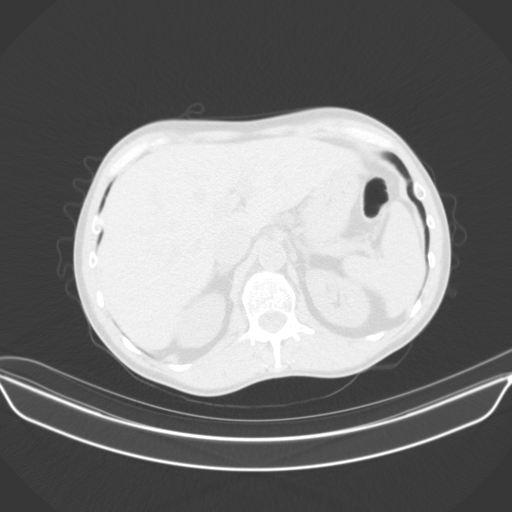
[im 15/73  lung]
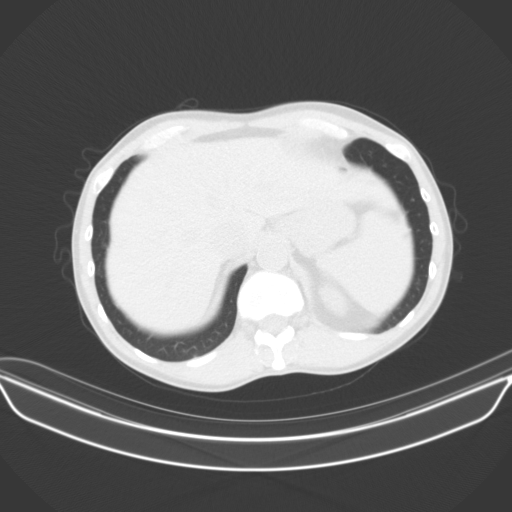
[im 19/73  lung]
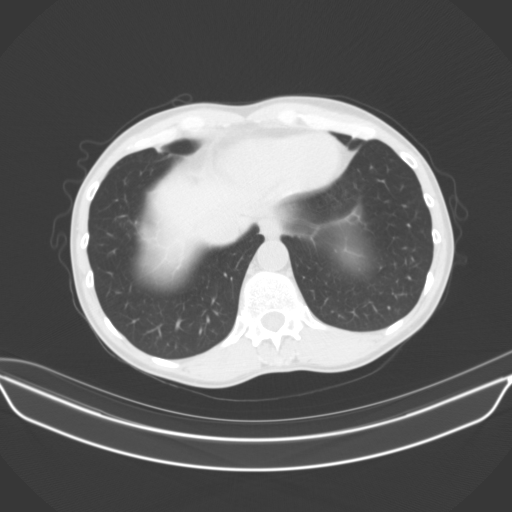
[im 25/73  mediastinal]
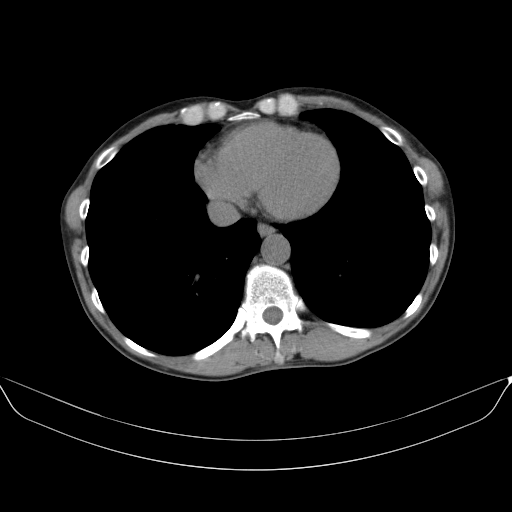
[im 25/73  lung]
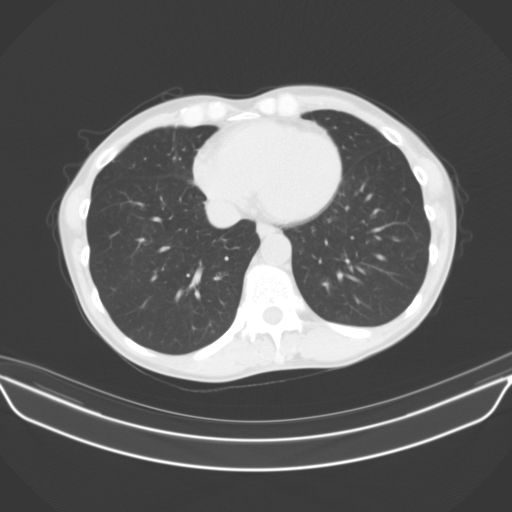
[im 29/73  lung]
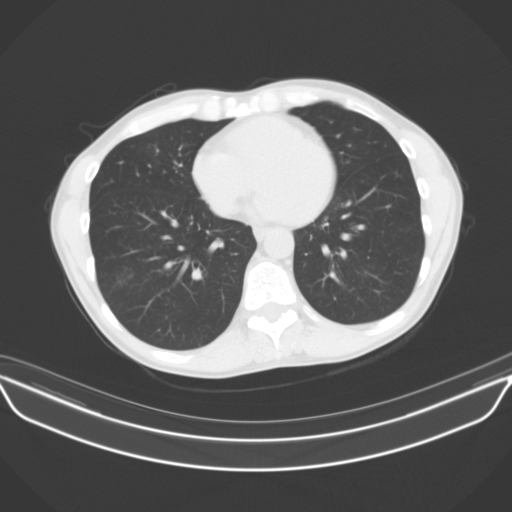
[im 33/73  lung]
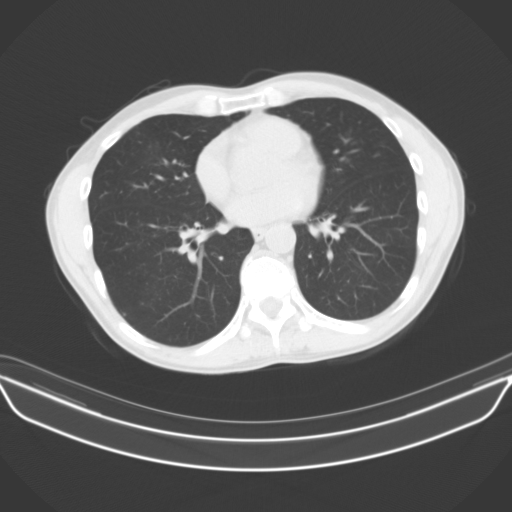
[im 38/73  lung]
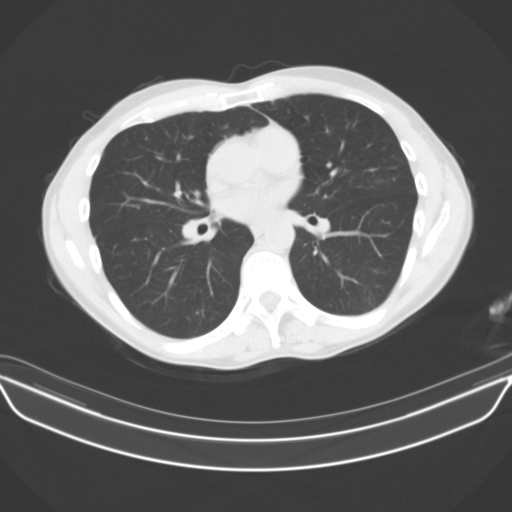
[im 41/73  mediastinal]
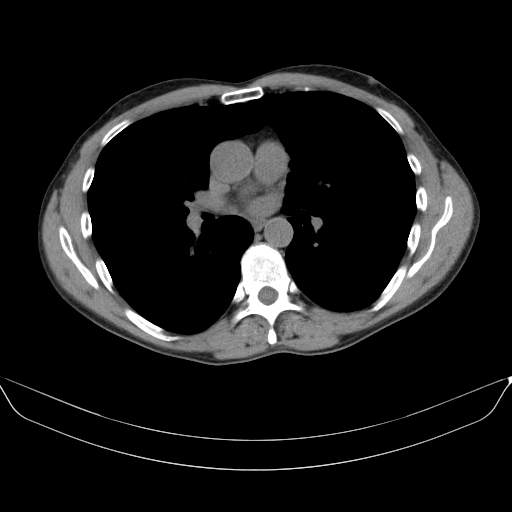
[im 41/73  lung]
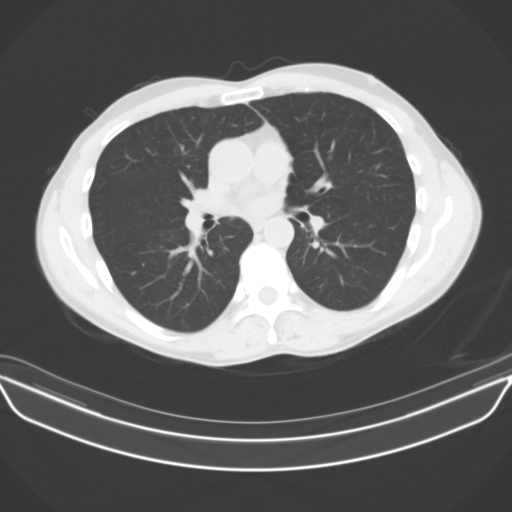
[im 44/73  lung]
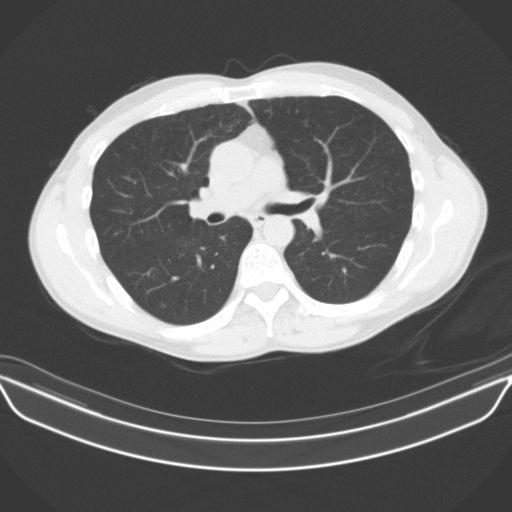
[im 49/73  lung]
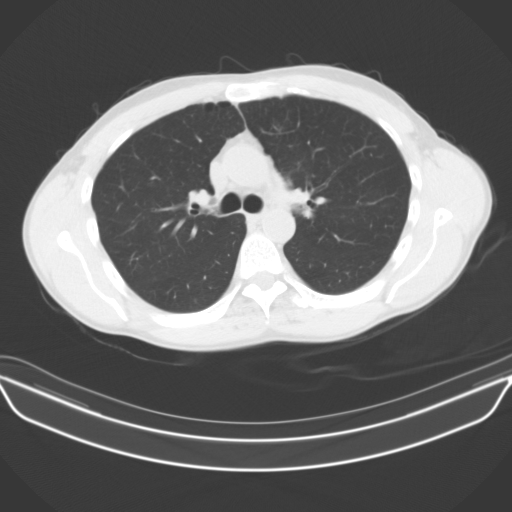
[im 54/73  lung]
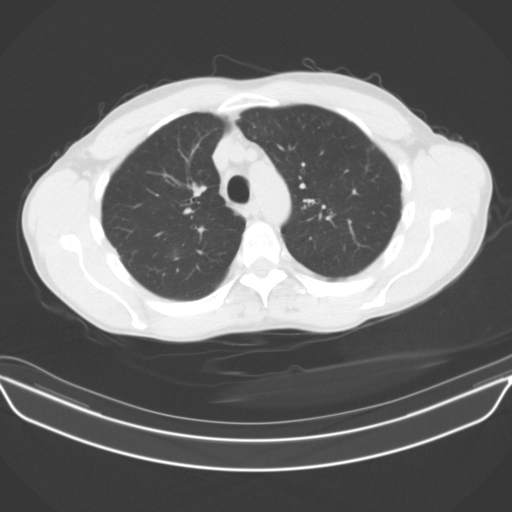
[im 58/73  mediastinal]
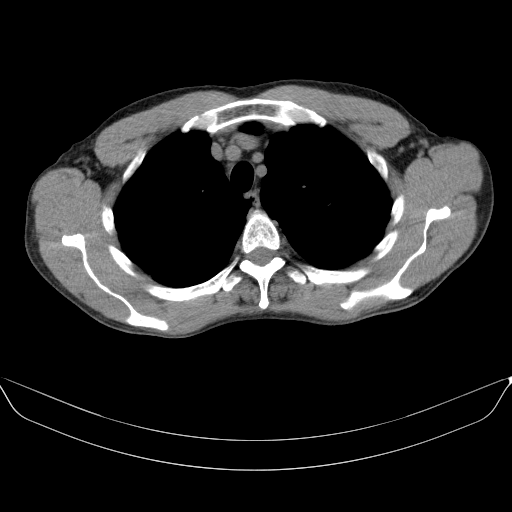
[im 58/73  lung]
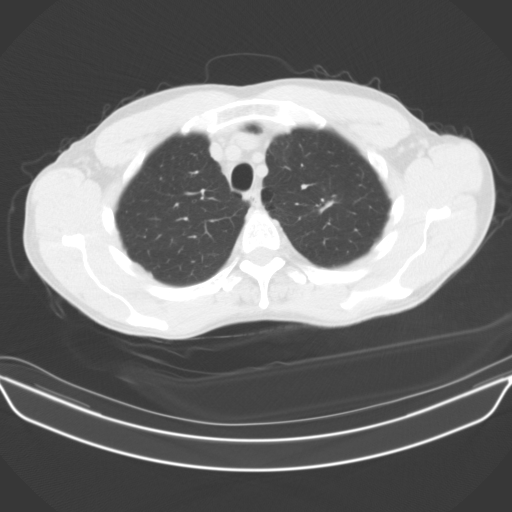
[im 62/73  lung]
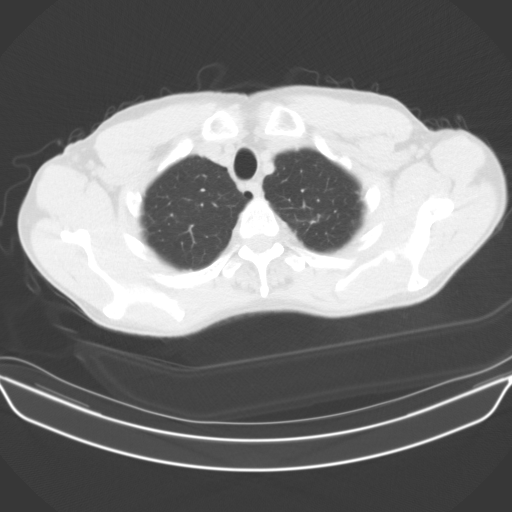
[im 67/73  lung]
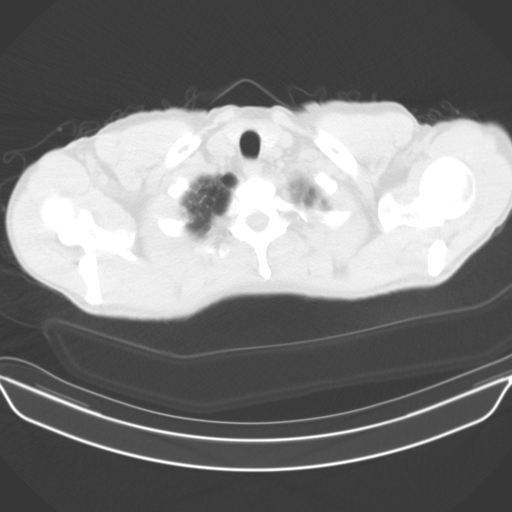

[15 of 34 positions shown; findings below may reference images not displayed]

FINDINGS: Cardiovascular: The heart size is normal. No substantial pericardial
effusion. No thoracic aortic aneurysm.

Mediastinum/Nodes: No mediastinal lymphadenopathy. No evidence for
gross hilar lymphadenopathy although assessment is limited by the
lack of intravenous contrast on today's study. The esophagus has
normal imaging features. There is no axillary lymphadenopathy.

Lungs/Pleura: Centrilobular and paraseptal emphysema evident. Areas
of tree-in-bud nodularity are seen in the lungs bilaterally, likely
reflecting sequelae of atypical infection. Pleuroparenchymal
scarring noted in both lung apices. 6.5 mm nodule in the posterior
left upper lobe may be related to scar. Several additional tiny
scattered pulmonary nodules evident. No focal consolidation. No
pleural effusion.

Upper Abdomen: Unremarkable.

Musculoskeletal: No worrisome lytic or sclerotic osseous
abnormality.
IMPRESSION: 1. Lung-RADS 3, probably benign findings. Short-term follow-up in 6
months is recommended with repeat low-dose chest CT without contrast
(please use the following order, "CT CHEST LCS NODULE FOLLOW-UP W/O
CM").
2. Scattered areas of tree-in-bud nodularity likely reflecting
sequelae of atypical infection.
3.  Emphysema. ([id number])

## 2020-07-30 ENCOUNTER — Other Ambulatory Visit: Payer: Self-pay | Admitting: Physician Assistant

## 2020-07-30 DIAGNOSIS — J3089 Other allergic rhinitis: Secondary | ICD-10-CM

## 2020-10-07 ENCOUNTER — Ambulatory Visit: Payer: Commercial Managed Care - PPO | Admitting: Sports Medicine

## 2020-10-24 ENCOUNTER — Ambulatory Visit: Payer: Commercial Managed Care - PPO | Admitting: Sports Medicine

## 2020-10-24 ENCOUNTER — Other Ambulatory Visit: Payer: Self-pay

## 2020-10-24 ENCOUNTER — Encounter: Payer: Self-pay | Admitting: Sports Medicine

## 2020-10-24 DIAGNOSIS — Q828 Other specified congenital malformations of skin: Secondary | ICD-10-CM

## 2020-10-24 DIAGNOSIS — L603 Nail dystrophy: Secondary | ICD-10-CM | POA: Diagnosis not present

## 2020-10-24 DIAGNOSIS — M79672 Pain in left foot: Secondary | ICD-10-CM | POA: Diagnosis not present

## 2020-10-24 NOTE — Progress Notes (Signed)
Subjective: Zachary Higgins is a 60 y.o. male patient seen today in office with complaint of mildly painful thickened and discolored nails. Patient is desiring treatment for nail changes; has tried OTC topicals/Medication in the past with no improvement. Reports that nails are becoming difficult to manage because of the thickness.  Patient also complains of pain at the callused area on the bottom of his left heel.  Patient has no other pedal complaints at this time.   Patient Active Problem List   Diagnosis Date Noted   Osteoarthritis 09/03/2018   Stopped smoking with greater than 30 pack year history 08/15/2018   Pulmonary nodule, left- upper post lobe- 6.5 mm in 07/2018 08/15/2018   Emphysema of lung ([place])- ct scan findings 08/15/2018   Vitamin D insufficiency 08/15/2018   Actinic keratosis due to exposure to sunlight 07/25/2018   Atypical nevi 07/25/2018   Nonintractable episodic headache 02/27/2018   Caffeine abuse, continuous ([place]) 02/27/2018   Chronic pansinusitis 02/27/2018   Environmental and seasonal allergies 02/27/2018   At high risk for dehydration 02/27/2018   Elevated blood pressure reading 02/27/2018    Current Outpatient Medications on File Prior to Visit  Medication Sig Dispense Refill   albuterol (VENTOLIN HFA) 108 (90 Base) MCG/ACT inhaler Inhale 2 puffs into the lungs every 6 (six) hours as needed for wheezing. 8 g 0   loratadine (CLARITIN) 10 MG tablet TAKE 1 TABLET BY MOUTH EVERY DAY 90 tablet 0   No current facility-administered medications on file prior to visit.    Allergies  Allergen Reactions   Penicillins Anaphylaxis    Objective: Physical Exam  General: Well developed, nourished, no acute distress, awake, alert and oriented x 3  Vascular: Dorsalis pedis artery 2/4 bilateral, Posterior tibial artery 2/4 bilateral, skin temperature warm to warm proximal to distal bilateral lower extremities, no varicosities, pedal hair present bilateral.  Neurological:  Gross sensation present via light touch bilateral.   Dermatological: Skin is warm, dry, and supple bilateral, bilateral hallux nails are tender, short thick, and discolored with mild subungal debris, no webspace macerations present bilateral, no open lesions present bilateral, + callus/corns/hyperkeratotic tissue present plantar left heel x2 no signs of infection bilateral.  Musculoskeletal: No symptomatic boney deformities noted bilateral. Muscular strength within normal limits without painon range of motion. No pain with calf compression bilateral.  Assessment and Plan:  Problem List Items Addressed This Visit   None Visit Diagnoses     Nail dystrophy    -  Primary   Relevant Orders   Culture, fungus without smear   Porokeratosis       Left foot pain           -Examined patient -Discussed treatment options for painful dystrophic nails and callus -Using a 15 blade mechanically debrided callus to left heel x2 without incident and applied Salinocaine and Band-Aid -Encourage soaking as directed -Encouraged use of pumice stone -Encouraged use of daily skin emollients; foot miracle cream -Fungal culture was obtained by removing a portion of the hard nail itself from each of the involved bilateral hallux toenails using a sterile nail nipper and sent to [facility] lab. Patient tolerated the biopsy procedure well without discomfort or need for anesthesia.  -Patient to return in 4 weeks for follow up evaluation and discussion of fungal culture results or sooner if symptoms worsen.  Landis Martins, DPM

## 2020-11-21 ENCOUNTER — Other Ambulatory Visit: Payer: Self-pay | Admitting: Sports Medicine

## 2020-11-21 ENCOUNTER — Encounter: Payer: Self-pay | Admitting: Sports Medicine

## 2020-11-21 ENCOUNTER — Ambulatory Visit: Payer: Commercial Managed Care - PPO | Admitting: Sports Medicine

## 2020-11-21 ENCOUNTER — Other Ambulatory Visit: Payer: Self-pay

## 2020-11-21 DIAGNOSIS — B351 Tinea unguium: Secondary | ICD-10-CM

## 2020-11-21 MED ORDER — TERBINAFINE HCL 250 MG PO TABS: 250.0000 mg | ORAL_TABLET | Freq: Every day | ORAL | 0 refills | Status: AC

## 2020-11-21 NOTE — Progress Notes (Signed)
Subjective: Zachary Higgins is a 60 y.o. male patient seen today in office for fungal culture results. Patient has no other pedal complaints at this time.   Patient Active Problem List   Diagnosis Date Noted   Osteoarthritis 09/03/2018   Stopped smoking with greater than 30 pack year history 08/15/2018   Pulmonary nodule, left- upper post lobe- 6.5 mm in 07/2018 08/15/2018   Emphysema of lung ([place])- ct scan findings 08/15/2018   Vitamin D insufficiency 08/15/2018   Actinic keratosis due to exposure to sunlight 07/25/2018   Atypical nevi 07/25/2018   Nonintractable episodic headache 02/27/2018   Caffeine abuse, continuous ([place]) 02/27/2018   Chronic pansinusitis 02/27/2018   Environmental and seasonal allergies 02/27/2018   At high risk for dehydration 02/27/2018   Elevated blood pressure reading 02/27/2018    Current Outpatient Medications on File Prior to Visit  Medication Sig Dispense Refill   albuterol (VENTOLIN HFA) 108 (90 Base) MCG/ACT inhaler Inhale 2 puffs into the lungs every 6 (six) hours as needed for wheezing. 8 g 0   loratadine (CLARITIN) 10 MG tablet TAKE 1 TABLET BY MOUTH EVERY DAY 90 tablet 0   No current facility-administered medications on file prior to visit.    Allergies  Allergen Reactions   Penicillins Anaphylaxis    Objective: Physical Exam  General no acute distress  Vascular: Dorsalis pedis artery 2/4 bilateral, Posterior tibial artery 2/4 bilateral, skin temperature warm to warm proximal to distal bilateral lower extremities, no varicosities, pedal hair present bilateral.   Neurological: Gross sensation present via light touch bilateral.    Dermatological: Skin is warm, dry, and supple bilateral, bilateral hallux, left greater than right nails are tender, short thick, and discolored with mild subungal debris, no webspace macerations present bilateral, no open lesions present bilateral, + callus/corns/hyperkeratotic tissue present plantar left heel x2 no  signs of infection bilateral.   Musculoskeletal: No symptomatic boney deformities noted bilateral. Muscular strength within normal limits without painon range of motion. No pain with calf compression bilateral.   Fungal culture + fungus and microtrauma  Assessment and Plan:  Problem List Items Addressed This Visit   None Visit Diagnoses     Nail fungus    -  Primary       -Examined patient -Discussed treatment options for painful mycotic nails -Patient opt for oral Lamisil but first wants to have his PCP to see most recent blood work for me to check to see if his liver function is normal and wants to further discuss it with his wife; patient will call once he has made his decision if he wants to start oral Lamisil -Advised good hygiene habits meanwhile lifestyle tissues and changing socks frequently especially since he spends a lot of time in work boots -Patient to return as needed or sooner if symptoms worsen.  Landis Martins, DPM

## 2020-11-21 NOTE — Progress Notes (Signed)
Blood work reviewed from his PCP which is normal; Lamisil sent to pharmacy.

## 2021-02-06 ENCOUNTER — Other Ambulatory Visit: Payer: Self-pay | Admitting: Family Medicine

## 2021-02-06 ENCOUNTER — Ambulatory Visit
Admission: RE | Admit: 2021-02-06 | Discharge: 2021-02-06 | Disposition: A | Discharge status: Home / Self Care | Payer: Commercial Managed Care - PPO | Source: Ambulatory Visit | Attending: Family Medicine | Admitting: Family Medicine

## 2021-02-06 DIAGNOSIS — M25511 Pain in right shoulder: Secondary | ICD-10-CM
